# Patient Record
Sex: Male | Born: 1985 | Race: Black or African American | Hispanic: No | Marital: Single | Smoking: Current every day smoker
Health system: Southern US, Community
[De-identification: ages and names within clinical notes are randomized; demographics above are authoritative.]

## PROBLEM LIST (undated history)

## (undated) ENCOUNTER — Emergency Department (HOSPITAL_COMMUNITY): Disposition: A | Discharge status: Home / Self Care | Payer: Self-pay

## (undated) DIAGNOSIS — F32A Depression, unspecified: Secondary | ICD-10-CM

## (undated) DIAGNOSIS — F329 Major depressive disorder, single episode, unspecified: Secondary | ICD-10-CM

---
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
## 2007-12-24 ENCOUNTER — Emergency Department (HOSPITAL_COMMUNITY): Admission: EM | Admit: 2007-12-24 | Discharge: 2007-12-24 | Payer: Self-pay | Admitting: Emergency Medicine

## 2008-11-06 ENCOUNTER — Emergency Department (HOSPITAL_COMMUNITY): Admission: EM | Admit: 2008-11-06 | Discharge: 2008-11-06 | Payer: Self-pay | Admitting: Emergency Medicine

## 2008-12-23 ENCOUNTER — Emergency Department (HOSPITAL_COMMUNITY): Admission: EM | Admit: 2008-12-23 | Discharge: 2008-12-24 | Payer: Self-pay | Admitting: Emergency Medicine

## 2009-07-07 ENCOUNTER — Emergency Department (HOSPITAL_COMMUNITY): Admission: EM | Admit: 2009-07-07 | Discharge: 2009-07-07 | Payer: Self-pay | Admitting: Emergency Medicine

## 2011-04-02 LAB — GC/CHLAMYDIA PROBE AMP, GENITAL
Chlamydia, DNA Probe: NEGATIVE
GC Probe Amp, Genital: NEGATIVE

## 2011-06-18 ENCOUNTER — Emergency Department (HOSPITAL_COMMUNITY): Payer: Self-pay

## 2011-06-18 ENCOUNTER — Emergency Department (HOSPITAL_COMMUNITY)
Admission: EM | Admit: 2011-06-18 | Discharge: 2011-06-19 | Disposition: A | Discharge status: Home / Self Care | Payer: Self-pay | Attending: Emergency Medicine | Admitting: Emergency Medicine

## 2011-06-18 DIAGNOSIS — N509 Disorder of male genital organs, unspecified: Secondary | ICD-10-CM | POA: Insufficient documentation

## 2011-06-18 DIAGNOSIS — N342 Other urethritis: Secondary | ICD-10-CM | POA: Insufficient documentation

## 2011-06-18 DIAGNOSIS — R369 Urethral discharge, unspecified: Secondary | ICD-10-CM | POA: Insufficient documentation

## 2011-06-18 DIAGNOSIS — M79609 Pain in unspecified limb: Secondary | ICD-10-CM | POA: Insufficient documentation

## 2011-06-18 LAB — URINALYSIS, ROUTINE W REFLEX MICROSCOPIC
Bilirubin Urine: NEGATIVE
Glucose, UA: NEGATIVE mg/dL
Hgb urine dipstick: NEGATIVE
Ketones, ur: NEGATIVE mg/dL
Leukocytes, UA: NEGATIVE
Nitrite: NEGATIVE
Protein, ur: NEGATIVE mg/dL
Specific Gravity, Urine: 1.028 (ref 1.005–1.030)
Urobilinogen, UA: 1 mg/dL (ref 0.0–1.0)
pH: 7 (ref 5.0–8.0)

## 2011-06-21 ENCOUNTER — Emergency Department (HOSPITAL_COMMUNITY)
Admission: EM | Admit: 2011-06-21 | Discharge: 2011-06-21 | Disposition: A | Discharge status: Home / Self Care | Payer: Self-pay | Attending: Emergency Medicine | Admitting: Emergency Medicine

## 2011-06-21 DIAGNOSIS — K029 Dental caries, unspecified: Secondary | ICD-10-CM | POA: Insufficient documentation

## 2011-06-21 DIAGNOSIS — K089 Disorder of teeth and supporting structures, unspecified: Secondary | ICD-10-CM | POA: Insufficient documentation

## 2011-09-05 ENCOUNTER — Emergency Department (HOSPITAL_COMMUNITY): Payer: Self-pay

## 2011-09-05 ENCOUNTER — Emergency Department (HOSPITAL_COMMUNITY)
Admission: EM | Admit: 2011-09-05 | Discharge: 2011-09-05 | Disposition: A | Discharge status: Home / Self Care | Payer: Self-pay | Attending: Emergency Medicine | Admitting: Emergency Medicine

## 2011-09-05 DIAGNOSIS — R51 Headache: Secondary | ICD-10-CM | POA: Insufficient documentation

## 2011-09-05 DIAGNOSIS — IMO0002 Reserved for concepts with insufficient information to code with codable children: Secondary | ICD-10-CM | POA: Insufficient documentation

## 2011-09-05 DIAGNOSIS — S61209A Unspecified open wound of unspecified finger without damage to nail, initial encounter: Secondary | ICD-10-CM | POA: Insufficient documentation

## 2011-09-05 DIAGNOSIS — Y9229 Other specified public building as the place of occurrence of the external cause: Secondary | ICD-10-CM | POA: Insufficient documentation

## 2011-09-05 DIAGNOSIS — T148XXA Other injury of unspecified body region, initial encounter: Secondary | ICD-10-CM | POA: Insufficient documentation

## 2011-09-05 DIAGNOSIS — R1110 Vomiting, unspecified: Secondary | ICD-10-CM | POA: Insufficient documentation

## 2011-09-05 DIAGNOSIS — R209 Unspecified disturbances of skin sensation: Secondary | ICD-10-CM | POA: Insufficient documentation

## 2011-09-29 LAB — URINALYSIS, ROUTINE W REFLEX MICROSCOPIC
Glucose, UA: NEGATIVE
Hgb urine dipstick: NEGATIVE
Protein, ur: NEGATIVE
Specific Gravity, Urine: 1.035 — ABNORMAL HIGH
pH: 7

## 2011-09-29 LAB — DIFFERENTIAL
Basophils Relative: 0 % (ref 0–1)
Monocytes Absolute: 0.6 10*3/uL (ref 0.1–1.0)
Monocytes Relative: 10 % (ref 3–12)
Neutro Abs: 4.3 10*3/uL (ref 1.7–7.7)

## 2011-09-29 LAB — COMPREHENSIVE METABOLIC PANEL
ALT: 17 U/L (ref 0–53)
AST: 29 U/L (ref 0–37)
CO2: 25 mEq/L (ref 19–32)
Calcium: 9.2 mg/dL (ref 8.4–10.5)
Chloride: 104 mEq/L (ref 96–112)
Creatinine, Ser: 0.97 mg/dL (ref 0.4–1.5)
GFR calc Af Amer: >60 mL/min (ref >60)
GFR calc non Af Amer: >60 mL/min (ref >60)
Glucose, Bld: 84 mg/dL (ref 70–99)
Total Bilirubin: 1.4 mg/dL — ABNORMAL HIGH (ref 0.3–1.2)

## 2011-09-29 LAB — CBC
Hemoglobin: 15.9 g/dL (ref 13.0–17.0)
MCHC: 33 g/dL (ref 30.0–36.0)
MCV: 84.5 fL (ref 78.0–100.0)
RBC: 5.69 MIL/uL (ref 4.22–5.81)
WBC: 6.2 10*3/uL (ref 4.0–10.5)

## 2011-09-29 LAB — RPR: RPR Ser Ql: NONREACTIVE

## 2011-09-29 LAB — GC/CHLAMYDIA PROBE AMP, GENITAL
Chlamydia, DNA Probe: NEGATIVE
GC Probe Amp, Genital: NEGATIVE

## 2011-12-21 ENCOUNTER — Emergency Department (HOSPITAL_COMMUNITY)
Admission: EM | Admit: 2011-12-21 | Discharge: 2011-12-21 | Disposition: A | Discharge status: Home / Self Care | Payer: Self-pay | Attending: Emergency Medicine | Admitting: Emergency Medicine

## 2011-12-21 ENCOUNTER — Encounter: Payer: Self-pay | Admitting: *Deleted

## 2011-12-21 ENCOUNTER — Emergency Department (HOSPITAL_COMMUNITY): Payer: Self-pay

## 2011-12-21 DIAGNOSIS — J45909 Unspecified asthma, uncomplicated: Secondary | ICD-10-CM | POA: Insufficient documentation

## 2011-12-21 DIAGNOSIS — R197 Diarrhea, unspecified: Secondary | ICD-10-CM | POA: Insufficient documentation

## 2011-12-21 DIAGNOSIS — K529 Noninfective gastroenteritis and colitis, unspecified: Secondary | ICD-10-CM

## 2011-12-21 DIAGNOSIS — R109 Unspecified abdominal pain: Secondary | ICD-10-CM | POA: Insufficient documentation

## 2011-12-21 DIAGNOSIS — K5289 Other specified noninfective gastroenteritis and colitis: Secondary | ICD-10-CM | POA: Insufficient documentation

## 2011-12-21 DIAGNOSIS — R112 Nausea with vomiting, unspecified: Secondary | ICD-10-CM | POA: Insufficient documentation

## 2011-12-21 DIAGNOSIS — Z79899 Other long term (current) drug therapy: Secondary | ICD-10-CM | POA: Insufficient documentation

## 2011-12-21 LAB — COMPREHENSIVE METABOLIC PANEL
ALT: 13 U/L (ref 0–53)
AST: 24 U/L (ref 0–37)
Alkaline Phosphatase: 54 U/L (ref 39–117)
CO2: 24 mEq/L (ref 19–32)
GFR calc Af Amer: >90 mL/min (ref >90)
GFR calc non Af Amer: >90 mL/min (ref >90)
Glucose, Bld: 96 mg/dL (ref 70–99)
Potassium: 3.6 mEq/L (ref 3.5–5.1)
Sodium: 139 mEq/L (ref 135–145)
Total Protein: 7.7 g/dL (ref 6.0–8.3)

## 2011-12-21 LAB — DIFFERENTIAL
Basophils Absolute: 0 10*3/uL (ref 0.0–0.1)
Lymphocytes Relative: 5 % — ABNORMAL LOW (ref 12–46)
Lymphs Abs: 0.6 10*3/uL — ABNORMAL LOW (ref 0.7–4.0)
Neutro Abs: 9.8 10*3/uL — ABNORMAL HIGH (ref 1.7–7.7)
Neutrophils Relative %: 91 % — ABNORMAL HIGH (ref 43–77)

## 2011-12-21 LAB — CBC
Platelets: 176 10*3/uL (ref 150–400)
RBC: 5.55 MIL/uL (ref 4.22–5.81)
WBC: 10.8 10*3/uL — ABNORMAL HIGH (ref 4.0–10.5)

## 2011-12-21 MED ORDER — ONDANSETRON HCL 4 MG PO TABS: 4.0000 mg | ORAL_TABLET | Freq: Four times a day (QID) | ORAL | Status: DC

## 2011-12-21 MED ORDER — HYDROMORPHONE HCL PF 1 MG/ML IJ SOLN
1.0000 mg | Freq: Once | INTRAMUSCULAR | Status: AC
  Administered 2011-12-21: 1 mg via INTRAVENOUS
  Filled 2011-12-21: qty 1

## 2011-12-21 MED ORDER — CIPROFLOXACIN HCL 500 MG PO TABS: 500.0000 mg | ORAL_TABLET | Freq: Two times a day (BID) | ORAL | Status: DC

## 2011-12-21 MED ORDER — METRONIDAZOLE 500 MG PO TABS: 500.0000 mg | ORAL_TABLET | Freq: Three times a day (TID) | ORAL | Status: DC

## 2011-12-21 MED ORDER — IOHEXOL 300 MG/ML  SOLN
100.0000 mL | Freq: Once | INTRAMUSCULAR | Status: AC | PRN
  Administered 2011-12-21: 100 mL via INTRAVENOUS

## 2011-12-21 MED ORDER — ONDANSETRON HCL 4 MG/2ML IJ SOLN
4.0000 mg | Freq: Once | INTRAMUSCULAR | Status: AC
  Administered 2011-12-21: 4 mg via INTRAVENOUS
  Filled 2011-12-21: qty 2

## 2011-12-21 MED ORDER — SODIUM CHLORIDE 0.9 % IV SOLN
999.0000 mL | INTRAVENOUS | Status: DC
  Administered 2011-12-21: 12:00:00 via INTRAVENOUS

## 2011-12-21 MED ORDER — SODIUM CHLORIDE 0.9 % IV SOLN
Freq: Once | INTRAVENOUS | Status: AC
  Administered 2011-12-21: 14:00:00 via INTRAVENOUS

## 2011-12-21 MED ORDER — OXYCODONE-ACETAMINOPHEN 5-325 MG PO TABS: 1.0000 | ORAL_TABLET | ORAL | Status: DC | PRN

## 2011-12-21 MED ORDER — MORPHINE SULFATE 4 MG/ML IJ SOLN
4.0000 mg | Freq: Once | INTRAMUSCULAR | Status: AC
  Administered 2011-12-21: 4 mg via INTRAVENOUS
  Filled 2011-12-21: qty 1

## 2011-12-21 NOTE — ED Provider Notes (Signed)
The patient is moved to CDU to await CT scan for eval of abdominal pain. Additional pain medications given at his request for continued pain. Will continue to monitor.  Rodena Medin, PA 12/21/11 [phone]

## 2011-12-21 NOTE — ED Notes (Signed)
Patient states he has been vomiting and fever x 1 week and last night the vomiting got worse. Patient states he has been vomiting up green, black and yellow stuff. Patient states he is feeling weak and sick.

## 2011-12-21 NOTE — ED Notes (Signed)
Called ct spoke to steve. He is aware pt has finished his ct prep

## 2011-12-21 NOTE — ED Provider Notes (Signed)
Medical screening examination/treatment/procedure(s) were performed by non-physician practitioner and as supervising physician I was immediately available for consultation/collaboration.  Demri Poulton P Daylee Delahoz, MD 12/21/11 2003 

## 2011-12-21 NOTE — ED Notes (Signed)
Pt returned from CT °

## 2011-12-21 NOTE — ED Provider Notes (Signed)
History     CSN: [id number]  Arrival date & time 12/21/11  1022   First MD Initiated Contact with Patient 12/21/11 1108      Chief Complaint  Patient presents with  . Abdominal Pain    (Consider location/radiation/quality/duration/timing/severity/associated sxs/prior treatment) Patient is a 25 y.o. male presenting with abdominal pain. The history is provided by the patient.  Abdominal Pain The primary symptoms of the illness include abdominal pain, nausea, vomiting and diarrhea. The primary symptoms of the illness do not include fever or shortness of breath.  Symptoms associated with the illness do not include diaphoresis, hematuria or back pain.   The patient is a 25 year old, male, with a history of asthma.  He presents to the emergency department complaining of abdominal pain, and predominantly, nausea and vomiting.  He has had a few episodes of diarrhea as well.  His symptoms have been going on for approximately a week.  He denies a history of peptic ulcer disease, or alcohol use.  He has never had abdominal surgery in the past. Past Medical History  Diagnosis Date  . Asthma     History reviewed. No pertinent past surgical history.  History reviewed. No pertinent family history.  History  Substance Use Topics  . Smoking status: Current Everyday Smoker    Types: Cigarettes  . Smokeless tobacco: Not on file  . Alcohol Use: No      Review of Systems  Constitutional: Negative for fever and diaphoresis.  HENT: Negative for neck pain.   Eyes: Negative for redness.  Respiratory: Negative for cough, chest tightness and shortness of breath.   Cardiovascular: Negative for chest pain and palpitations.  Gastrointestinal: Positive for nausea, vomiting, abdominal pain and diarrhea.  Genitourinary: Negative for hematuria.  Musculoskeletal: Negative for back pain.  Neurological: Negative for headaches.  Psychiatric/Behavioral: Negative for confusion.    Allergies  Review of  patient's allergies indicates no known allergies.  Home Medications   Current Outpatient Rx  Name Route Sig Dispense Refill  . QUETIAPINE FUMARATE 100 MG PO TABS Oral Take 100 mg by mouth daily.      . SERTRALINE HCL 50 MG PO TABS Oral Take 50 mg by mouth daily.        BP 127/74  Pulse 85  Temp(Src) 97.9 F (36.6 C) (Oral)  Resp 24  SpO2 98%  Physical Exam  Constitutional: He is oriented to person, place, and time. He appears well-developed and well-nourished. No distress.       Uncomfortable appearing  HENT:  Head: Normocephalic and atraumatic.  Eyes: EOM are normal. Pupils are equal, round, and reactive to light.  Neck: Normal range of motion. Neck supple.  Cardiovascular: Normal rate, regular rhythm and normal heart sounds.   No murmur heard. Pulmonary/Chest: Effort normal and breath sounds normal. No respiratory distress. He has no wheezes. He has no rales.  Abdominal: Soft. Bowel sounds are normal. He exhibits no distension and no mass. There is tenderness. There is rebound and guarding.       Diffuse tenderness, with guarding  Musculoskeletal: Normal range of motion. He exhibits no edema and no tenderness.  Neurological: He is alert and oriented to person, place, and time. No cranial nerve deficit.  Skin: Skin is warm and dry. He is not diaphoretic.  Psychiatric: He has a normal mood and affect. His behavior is normal.    ED Course  Procedures (including critical care time) 25 year old, male, presents with one week of abdominal pain, vomiting, and diarrhea.  He he has diffuse abdominal tenderness, with guarding and rebound.  We will establish an IV perform laboratory testing, treat with analgesics, antiemetics, and also perform a CAT scan due to peritoneal signs   Labs Reviewed  CBC  DIFFERENTIAL  COMPREHENSIVE METABOLIC PANEL  LIPASE, BLOOD   No results found.   No diagnosis found.    MDM  Abdominal pain, vomiting        Nicholes Stairs,  MD 12/23/11 [phone]

## 2011-12-21 NOTE — ED Provider Notes (Signed)
Medical screening examination/treatment/procedure(s) were performed by non-physician practitioner and as supervising physician I was immediately available for consultation/collaboration.  Nicholes Stairs, MD 12/21/11 2003

## 2011-12-21 NOTE — ED Provider Notes (Signed)
pATIENT TO BE SENT HOME WITH ABX FOR COLITIS AND GI FOLLOW UP WITH ANY PERSISTENT.  Rodena Medin, PA 12/21/11 1539

## 2011-12-21 NOTE — Discharge Instructions (Signed)
FOLLOW UP WITH DR. Madilyn Fireman FOR RECHECK OF ABDOMINAL PAIN IF NO BETTER IN ANOTHER 2-3 DAYS. TAKE MEDICATIONS AS PRESCRIBED. BE AWARE THAT TAKING PERCOCET CAN CAUSE CONSTIPATION, SO TREAT TAKE THEM ONLY WHEN NEEDED.   Colitis Colitis is inflammation of the colon. Colitis can be a short-term or long-standing (chronic) illness. Crohn's disease and ulcerative colitis are 2 types of colitis which are chronic. They usually require lifelong treatment. CAUSES  There are many different causes of colitis, including:  Viruses.   Germs (bacteria).   Medicine reactions.  SYMPTOMS   Diarrhea.   Intestinal bleeding.   Pain.   Fever.   Throwing up (vomiting).   Tiredness (fatigue).   Weight loss.   Bowel blockage.  DIAGNOSIS  The diagnosis of colitis is based on examination and stool or blood tests. X-rays, CT scan, and colonoscopy may also be needed. TREATMENT  Treatment may include:  Fluids given through the vein (intravenously).   Bowel rest (nothing to eat or drink for a period of time).   Medicine for pain and diarrhea.   Medicines (antibiotics) that kill germs.   Cortisone medicines.   Surgery.  HOME CARE INSTRUCTIONS   Get plenty of rest.   Drink enough water and fluids to keep your urine clear or pale yellow.   Eat a well-balanced diet.   Call your caregiver for follow-up as recommended.  SEEK IMMEDIATE MEDICAL CARE IF:   You develop chills.   You have an oral temperature above 102 F (38.9 C), not controlled by medicine.   You have extreme weakness, fainting, or dehydration.   You have repeated vomiting.   You develop severe belly (abdominal) pain or are passing bloody or tarry stools.  MAKE SURE YOU:   Understand these instructions.   Will watch your condition.   Will get help right away if you are not doing well or get worse.  Document Released: 01/18/2005 Document Revised: 08/23/2011 Document Reviewed: 04/15/2010 [facility] Patient Information 2012  [place], [place].

## 2011-12-23 ENCOUNTER — Inpatient Hospital Stay (HOSPITAL_COMMUNITY): Payer: Self-pay

## 2011-12-23 ENCOUNTER — Encounter (HOSPITAL_COMMUNITY): Payer: Self-pay | Admitting: Emergency Medicine

## 2011-12-23 ENCOUNTER — Inpatient Hospital Stay (HOSPITAL_COMMUNITY)
Admission: EM | Admit: 2011-12-23 | Discharge: 2011-12-24 | DRG: 392 | Discharge status: Against Medical Advice | Payer: Self-pay | Attending: Internal Medicine | Admitting: Internal Medicine

## 2011-12-23 DIAGNOSIS — F17200 Nicotine dependence, unspecified, uncomplicated: Secondary | ICD-10-CM | POA: Diagnosis present

## 2011-12-23 DIAGNOSIS — R112 Nausea with vomiting, unspecified: Secondary | ICD-10-CM

## 2011-12-23 DIAGNOSIS — E860 Dehydration: Secondary | ICD-10-CM | POA: Diagnosis present

## 2011-12-23 DIAGNOSIS — K921 Melena: Secondary | ICD-10-CM

## 2011-12-23 DIAGNOSIS — R1115 Cyclical vomiting syndrome unrelated to migraine: Secondary | ICD-10-CM

## 2011-12-23 DIAGNOSIS — J45909 Unspecified asthma, uncomplicated: Secondary | ICD-10-CM | POA: Diagnosis present

## 2011-12-23 DIAGNOSIS — F329 Major depressive disorder, single episode, unspecified: Secondary | ICD-10-CM

## 2011-12-23 DIAGNOSIS — F32A Depression, unspecified: Secondary | ICD-10-CM

## 2011-12-23 DIAGNOSIS — E876 Hypokalemia: Secondary | ICD-10-CM | POA: Diagnosis present

## 2011-12-23 DIAGNOSIS — F3289 Other specified depressive episodes: Secondary | ICD-10-CM | POA: Diagnosis present

## 2011-12-23 DIAGNOSIS — R109 Unspecified abdominal pain: Principal | ICD-10-CM

## 2011-12-23 HISTORY — DX: Major depressive disorder, single episode, unspecified: F32.9

## 2011-12-23 HISTORY — DX: Depression, unspecified: F32.A

## 2011-12-23 LAB — CBC
HCT: 45.9 % (ref 39.0–52.0)
Hemoglobin: 15.9 g/dL (ref 13.0–17.0)
MCH: 28.9 pg (ref 26.0–34.0)
MCHC: 34.6 g/dL (ref 30.0–36.0)
MCV: 83.5 fL (ref 78.0–100.0)
Platelets: 185 K/uL (ref 150–400)
RBC: 5.5 MIL/uL (ref 4.22–5.81)
RDW: 12.8 % (ref 11.5–15.5)
WBC: 7.2 K/uL (ref 4.0–10.5)

## 2011-12-23 LAB — BASIC METABOLIC PANEL
BUN: 19 mg/dL (ref 6–23)
Calcium: 9.9 mg/dL (ref 8.4–10.5)
Creatinine, Ser: 1.01 mg/dL (ref 0.50–1.35)
GFR calc non Af Amer: >90 mL/min (ref >90)
Glucose, Bld: 88 mg/dL (ref 70–99)
Sodium: 137 mEq/L (ref 135–145)

## 2011-12-23 LAB — URINALYSIS, ROUTINE W REFLEX MICROSCOPIC
Glucose, UA: NEGATIVE mg/dL
Hgb urine dipstick: NEGATIVE
Ketones, ur: >80 mg/dL — AB
Protein, ur: NEGATIVE mg/dL
Urobilinogen, UA: 1 mg/dL (ref 0.0–1.0)

## 2011-12-23 LAB — RAPID URINE DRUG SCREEN, HOSP PERFORMED
Amphetamines: NOT DETECTED
Barbiturates: NOT DETECTED
Benzodiazepines: NOT DETECTED
Cocaine: POSITIVE — AB
Opiates: NOT DETECTED
Tetrahydrocannabinol: POSITIVE — AB

## 2011-12-23 LAB — DIFFERENTIAL
Eosinophils Absolute: 0.1 10*3/uL (ref 0.0–0.7)
Eosinophils Relative: 1 % (ref 0–5)
Lymphs Abs: 1.9 10*3/uL (ref 0.7–4.0)
Monocytes Absolute: 0.9 10*3/uL (ref 0.1–1.0)
Monocytes Relative: 12 % (ref 3–12)

## 2011-12-23 LAB — INFLUENZA PANEL BY PCR (TYPE A & B)
H1N1 flu by pcr: NOT DETECTED
Influenza A By PCR: NEGATIVE
Influenza B By PCR: NEGATIVE

## 2011-12-23 LAB — OCCULT BLOOD, POC DEVICE: Fecal Occult Bld: NEGATIVE

## 2011-12-23 LAB — LIPASE, BLOOD: Lipase: 21 U/L (ref 11–59)

## 2011-12-23 LAB — HIV ANTIBODY (ROUTINE TESTING W REFLEX): HIV: NONREACTIVE

## 2011-12-23 LAB — LACTIC ACID, PLASMA: Lactic Acid, Venous: 0.9 mmol/L (ref 0.5–2.2)

## 2011-12-23 MED ORDER — FENTANYL CITRATE 0.05 MG/ML IJ SOLN
50.0000 ug | Freq: Once | INTRAMUSCULAR | Status: AC
  Administered 2011-12-23: 50 ug via INTRAVENOUS
  Filled 2011-12-23: qty 2

## 2011-12-23 MED ORDER — ENOXAPARIN SODIUM 40 MG/0.4ML ~~LOC~~ SOLN
40.0000 mg | SUBCUTANEOUS | Status: DC
  Filled 2011-12-23 (×2): qty 0.4

## 2011-12-23 MED ORDER — ONDANSETRON HCL 4 MG/2ML IJ SOLN: 4.0000 mg | Freq: Four times a day (QID) | INTRAMUSCULAR | Status: DC | PRN

## 2011-12-23 MED ORDER — PANTOPRAZOLE SODIUM 40 MG IV SOLR
40.0000 mg | Freq: Every day | INTRAVENOUS | Status: DC
  Administered 2011-12-23: 40 mg via INTRAVENOUS
  Filled 2011-12-23 (×2): qty 40

## 2011-12-23 MED ORDER — ACETAMINOPHEN 325 MG PO TABS
650.0000 mg | ORAL_TABLET | Freq: Four times a day (QID) | ORAL | Status: DC | PRN
  Administered 2011-12-24: 650 mg via ORAL
  Filled 2011-12-23: qty 2

## 2011-12-23 MED ORDER — SERTRALINE HCL 50 MG PO TABS
50.0000 mg | ORAL_TABLET | Freq: Every day | ORAL | Status: DC
  Administered 2011-12-23 – 2011-12-24 (×2): 50 mg via ORAL
  Filled 2011-12-23 (×2): qty 1

## 2011-12-23 MED ORDER — PROMETHAZINE HCL 25 MG/ML IJ SOLN
25.0000 mg | INTRAMUSCULAR | Status: AC
  Administered 2011-12-23: 25 mg via INTRAMUSCULAR
  Filled 2011-12-23: qty 1

## 2011-12-23 MED ORDER — ACETAMINOPHEN 650 MG RE SUPP: 650.0000 mg | Freq: Four times a day (QID) | RECTAL | Status: DC | PRN

## 2011-12-23 MED ORDER — QUETIAPINE FUMARATE 100 MG PO TABS
100.0000 mg | ORAL_TABLET | Freq: Every day | ORAL | Status: DC
  Administered 2011-12-23 – 2011-12-24 (×2): 100 mg via ORAL
  Filled 2011-12-23 (×3): qty 1

## 2011-12-23 MED ORDER — SODIUM CHLORIDE 0.9 % IV SOLN
INTRAVENOUS | Status: DC
  Administered 2011-12-23 – 2011-12-24 (×2): via INTRAVENOUS
  Administered 2011-12-24: 150 mL/h via INTRAVENOUS

## 2011-12-23 MED ORDER — POTASSIUM CHLORIDE 10 MEQ/100ML IV SOLN
10.0000 meq | Freq: Once | INTRAVENOUS | Status: AC
  Administered 2011-12-23: 10 meq via INTRAVENOUS
  Filled 2011-12-23: qty 100

## 2011-12-23 MED ORDER — ONDANSETRON HCL 4 MG PO TABS: 4.0000 mg | ORAL_TABLET | Freq: Four times a day (QID) | ORAL | Status: DC | PRN

## 2011-12-23 MED ORDER — SODIUM CHLORIDE 0.9 % IV BOLUS (SEPSIS)
1000.0000 mL | Freq: Once | INTRAVENOUS | Status: AC
  Administered 2011-12-23: 1000 mL via INTRAVENOUS

## 2011-12-23 NOTE — ED Notes (Signed)
RN went into pt's room to assess. Pt resting calmly. Pt reports belly pain feels like cramping and headache is 9/10. No neurological deficits. Pt is not actively vomiting or having diarrh.

## 2011-12-23 NOTE — ED Notes (Signed)
PT. REPORTS PERSISTENT VOMITTING AND DIARRHEA FOR SEVERAL WEEKS , MID ABDOMINAL CRAMPING ,  SLIGHT CHILLS WITH LOW GRADE FEVER .

## 2011-12-23 NOTE — H&P (Signed)
Hospital Admission Note Date: 12/23/2011  Patient name: Michael Glover Medical record number: [id number] Date of birth: 1986/03/19 Age: 25 y.o. Gender: male PCP: No primary provider on file.  Medical Service: Internal Medicine Teaching [facility]  Attending physician: Blanch Media, MD    1st Contact:  Kristie Cowman  [phone] 2nd Contact:  Almyra Deforest   [phone] After 5 pm or weekends: 1st Contact:      Pager: [phone] 2nd Contact:      Pager: [phone]  Chief Complaint: abdominal pain  History of Present Illness: Mr. Bracco is a 24 y.o. male without significant past medical history who presents to the ED with complaints of continued abdominal pain associated with black stool x1 and black/brown emesis x1.  He initially presented to the ED 2 days ago with complaints of abdominal pain for the past 1 week or so ago that was associated with some diarrhea.  He reports that it initially appeared to get better without treatment or over the counter medications.  He reports that he experienced an abrupt onset of vomiting and large black stool which prompted him to come to the ED 2 days ago. CT scan of the abdomen in the ED on 12/21/2011 demonstrated equivocal mild wall thickening of the majority of the colon and mild colitis could not be excluded. Patient was discharged from the ED with ciprofloxacin and Flagyl. He reports today that he has not been able to fill the prescriptions and further states that the abdominal pain has increased. He denies any further vomiting but states that he has been having dry heaves, dry cough, body aches, subjective fever, chills and headache. He denies fever, sick contacts, use of NSAIDs or BC/Goody powders, no history of sickle cell anemia, no HIV, and reports that he has not eaten in 2 days.  Meds: Medications Prior to Admission  Medication Sig Dispense Refill  . QUEtiapine (SEROQUEL) 100 MG tablet Take 100 mg by mouth daily.        .  sertraline (ZOLOFT) 50 MG tablet Take 50 mg by mouth daily.          Allergies: Review of patient's allergies indicates no known allergies. Past Medical History  Diagnosis Date  . Asthma   . Depression    History reviewed. No pertinent past surgical history. No family history on file. History   Social History  . Marital Status: Single    Spouse Name: N/A    Number of Children: N/A  . Years of Education: N/A   Occupational History  . Not on file.   Social History Main Topics  . Smoking status: Current Everyday Smoker    Types: Cigarettes  . Smokeless tobacco: Not on file  . Alcohol Use: No  . Drug Use: No  . Sexually Active:     Review of Systems: Reports depression managed by [facility] otherwise negative and as per HPI  Physical Exam: Blood pressure 139/82, pulse 58, temperature 98.5 F (36.9 C), temperature source Oral, resp. rate 18, SpO2 98.00%. General: Well-developed, well-nourished, black male, resting with a blanket covering his head, lying flat on bed with legs extended, in mild distress  Head: Normocephalic, atraumatic. Eyes: PERRL, EOMI, murky sclera  Nose: Mucous membranes moist, nonerythematous Throat: Oropharynx nonerythematous, no exudate appreciated.  Neck: No deformities, masses, or tenderness noted. Supple, No carotid Bruits, no JVD. Lungs: Normal respiratory effort. Clear to auscultation BL without crackles or wheezes. Heart: RRR. S1 and S2 normal without gallop, murmur, or rubs. Abdomen/GI: Hypoactive  bowel sound, flat, positive guarding, tender to palpation epigastrium, left upper and lower quadrant and left flank , no masses or organomegaly appreciated, normal rectal tone, no external hemorrhoids, minimal stool in vault, FOBT negative  Extremities: No pretibial edema, strong pedal pulses  Neurologic: nonfocal, alert, appropriate and cooperative throughout examination.   Lab results: Basic Metabolic Panel:  Basename 12/23/11 0129  12/21/11 1151  NA 137 139  K 3.3* 3.6  CL 100 104  CO2 25 24  GLUCOSE 88 96  BUN 19 17  CREATININE 1.01 0.87  CALCIUM 9.9 9.7  MG -- --  PHOS -- --   Liver Function Tests:  Basename 12/21/11 1151  AST 24  ALT 13  ALKPHOS 54  BILITOT 1.1  PROT 7.7  ALBUMIN 4.4    Basename 12/23/11 0129 12/21/11 1151  LIPASE 21 16  AMYLASE -- --    CBC:  Basename 12/23/11 0129 12/21/11 1151  WBC 7.2 10.8*  NEUTROABS 4.4 9.8*  HGB 15.9 16.1  HCT 45.9 46.2  MCV 83.5 83.2  PLT 185 176    Urine Drug Screen:  Drugs of Abuse     Component Value Date/Time   LABOPIA NONE DETECTED 12/23/2011 0459   COCAINSCRNUR POSITIVE* 12/23/2011 0459   LABBENZ NONE DETECTED 12/23/2011 0459   AMPHETMU NONE DETECTED 12/23/2011 0459   THCU POSITIVE* 12/23/2011 0459   LABBARB NONE DETECTED 12/23/2011 0459    Urinalysis:  Range  4:59 AM  Color, Urine  YELLOW  YELLOW   APPearance  CLEAR  CLOUDY (A)   Specific Gravity, Urine  1.005 - 1.030  1.033 (H)   pH  5.0 - 8.0  5.5   Glucose, UA  NEGATIVE mg/dL  NEGATIVE   Hgb urine dipstick  NEGATIVE  NEGATIVE   Bilirubin Urine  NEGATIVE  SMALL (A)   Ketones, ur  NEGATIVE mg/dL  >91 (A)   Protein, ur  NEGATIVE mg/dL  NEGATIVE   Urobilinogen, UA  0.0 - 1.0 mg/dL  1.0   Nitrite  NEGATIVE  NEGATIVE   Leukocytes, UA  NEGATIVE  NEGATIVE     Misc. Labs: HIV pending  Imaging results:  Ct Abdomen Pelvis W Contrast  12/21/2011  *RADIOLOGY REPORT*  Clinical Data: 26 year old male with abdominal and pelvic pain with fever.  CT ABDOMEN AND PELVIS WITH CONTRAST  Technique:  Multidetector CT imaging of the abdomen and pelvis was performed following the standard protocol during bolus administration of intravenous contrast.  Contrast: OMNIPAQUE IOHEXOL 300 MG/ML IV SOLN  Comparison: None  Findings: The lung bases are clear.  The liver, gallbladder, kidneys, pancreas, spleen and adrenal glands are unremarkable.  No free fluid, enlarged lymph nodes, biliary  dilation or abdominal aortic aneurysm identified.  There is equivocal mild wall thickening of the majority of the colon and a mild colitis is not excluded. No other bowel abnormalities are identified. The appendix is normal. The bladder within normal limits.  No acute or suspicious bony abnormalities are identified.  IMPRESSION: Equivocal mild diffuse colonic wall thickening - mild colitis is not excluded.  Correlate clinically with symptoms.  No other abnormalities identified.  Original Report Authenticated By: Rosendo Gros, M.D.   Dg Abd Portable 1v  12/23/2011  *RADIOLOGY REPORT*  Clinical Data: Abdominal pain and colitis  ABDOMEN - 1 VIEW  Comparison: CT abdomen pelvis with contrast 12/21/2011  Findings: The bowel gas pattern is nonobstructive.  Contrast material is seen within the colon from the recent CT abdomen pelvis.  No abdominal  mass effect.  The lung bases are clear. Calcified phleboliths in the pelvis bilaterally.  The bones appear normal.  IMPRESSION: Nonobstructive bowel gas pattern.  Some residual oral contrast is seen in the colon.  Original Report Authenticated By: Britta Mccreedy, M.D.     Assessment & Plan by Problem: 25 year old admitted with abdominal pain for over a week, black emesis x1 and black stool x1 with negative FOBT in addition to symptoms consistent with URI.   #1 Abdominal pain: Clinical presentation concerning for colitis, intestinal ischemia, gastritis, and  gastroenteritis. CT scan of the abdomen/pelvis could not exclude colitis, KUB without acute findings.  Patient denies diarrhea for well over a week thus C. difficile lower on the differential at this point. Urine drug screen positive for cocaine and marijuana metabolites. Given history of nausea, black emesis, and black stool his presentation is concerning for possible gastric ulcer and/or ischemic colitis. Recent CBC demonstrated a left shift with elevated neutrophils of 91%. Normal lipase and liver function tests. He  does not appear to have acute abdomen. Plan -Keep n.p.o. for now -Repeat FOBT x2 -start IV Protonix -check lactic acid -Obtain GI consult for further evaluation and management recommendations  #2 questionable Upper Respiratory Infection: Although his lung exam was clear, patient reports  dry cough, myalgias, headaches and subjective fevers and chills which was concerning for influenza.  Influenza Panel negative. Plan -treat symptomatically with antipyretics and mild analgesics -start IV normal saline   #3 Depression: Currently stable, he is followed by  Marlaine Hind with most recent office visit 3 months ago per patient report Plan -will continue home regimen of quetiapine and sertraline  #4 DVT ppx: Lovenox qd  Signed: Kristie Cowman 12/23/2011, 11:33 AM

## 2011-12-23 NOTE — ED Notes (Signed)
First meeting patient. Patient sleeping. Patient woke up when I called his name and responded with Yes when asked if he was all right this morning. NAD at this time.

## 2011-12-23 NOTE — ED Notes (Signed)
Patient is resting comfortably. 

## 2011-12-23 NOTE — Progress Notes (Signed)
Pt refusing additional lab work to be completed at this time, Dr. Bosie Clos notified.

## 2011-12-23 NOTE — ED Notes (Addendum)
Pt ready for transfer to 5155. Refused additional lab work at this time; requests waiting until later; had labs drawn less than 10 minutes ago.

## 2011-12-23 NOTE — ED Notes (Signed)
MD at bedside. 

## 2011-12-23 NOTE — Consult Note (Signed)
Reason for Consult abdominal pain: Questionable abnormal CT Referring Physician: Medical teaching service  Michael Glover is an 25 y.o. male.  HPI: Patient with an essentially negative GI history who had onset of abdominal pain and nausea and vomiting about a week ago and possibly threw up a little bit of blood but has not seen any blood in his bowels in fact has actually been constipated since he has not eatent much but did not have diarrhea and has not had any sick contacts or any at risk meals. He is actually hungry now and wants to eat. He came to the emergency room 2 days ago and a CT was done and he was given antibiotics but did not fill them. Prior to this he had no GI symptoms and his family history is negative as far as he knows although he does not know his family much. He smokes but does not drink and denies drug use and minimizes over-the-counter use and has no other specific complaints and has no new medicine Past Medical History  Diagnosis Date  . Asthma   . Depression     History reviewed. No pertinent past surgical history.  No family history on file.  Social History:  reports that he has been smoking Cigarettes.  He does not have any smokeless tobacco history on file. He reports that he does not drink alcohol or use illicit drugs.  Allergies: No Known Allergies  Medications: I have reviewed the patient's current medications.  Results for orders placed during the hospital encounter of 12/23/11 (from the past 48 hour(s))  CBC     Status: Normal   Collection Time   12/23/11  1:29 AM      Component Value Range Comment   WBC 7.2  4.0 - 10.5 (K/uL)    RBC 5.50  4.22 - 5.81 (MIL/uL)    Hemoglobin 15.9  13.0 - 17.0 (g/dL)    HCT 57.8  46.9 - 62.9 (%)    MCV 83.5  78.0 - 100.0 (fL)    MCH 28.9  26.0 - 34.0 (pg)    MCHC 34.6  30.0 - 36.0 (g/dL)    RDW 52.8  41.3 - 24.4 (%)    Platelets 185  150 - 400 (K/uL)   DIFFERENTIAL     Status: Normal   Collection Time   12/23/11   1:29 AM      Component Value Range Comment   Neutrophils Relative 61  43 - 77 (%)    Neutro Abs 4.4  1.7 - 7.7 (K/uL)    Lymphocytes Relative 26  12 - 46 (%)    Lymphs Abs 1.9  0.7 - 4.0 (K/uL)    Monocytes Relative 12  3 - 12 (%)    Monocytes Absolute 0.9  0.1 - 1.0 (K/uL)    Eosinophils Relative 1  0 - 5 (%)    Eosinophils Absolute 0.1  0.0 - 0.7 (K/uL)    Basophils Relative 0  0 - 1 (%)    Basophils Absolute 0.0  0.0 - 0.1 (K/uL)   BASIC METABOLIC PANEL     Status: Abnormal   Collection Time   12/23/11  1:29 AM      Component Value Range Comment   Sodium 137  135 - 145 (mEq/L)    Potassium 3.3 (*) 3.5 - 5.1 (mEq/L)    Chloride 100  96 - 112 (mEq/L)    CO2 25  19 - 32 (mEq/L)    Glucose, Bld 88  70 - 99 (mg/dL)    BUN 19  6 - 23 (mg/dL)    Creatinine, Ser 8.29  0.50 - 1.35 (mg/dL)    Calcium 9.9  8.4 - 10.5 (mg/dL)    GFR calc non Af Amer >90  >90 (mL/min)    GFR calc Af Amer >90  >90 (mL/min)   LIPASE, BLOOD     Status: Normal   Collection Time   12/23/11  1:29 AM      Component Value Range Comment   Lipase 21  11 - 59 (U/L)   URINALYSIS, ROUTINE W REFLEX MICROSCOPIC     Status: Abnormal   Collection Time   12/23/11  4:59 AM      Component Value Range Comment   Color, Urine YELLOW  YELLOW     APPearance CLOUDY (*) CLEAR     Specific Gravity, Urine 1.033 (*) 1.005 - 1.030     pH 5.5  5.0 - 8.0     Glucose, UA NEGATIVE  NEGATIVE (mg/dL)    Hgb urine dipstick NEGATIVE  NEGATIVE     Bilirubin Urine SMALL (*) NEGATIVE     Ketones, ur >80 (*) NEGATIVE (mg/dL)    Protein, ur NEGATIVE  NEGATIVE (mg/dL)    Urobilinogen, UA 1.0  0.0 - 1.0 (mg/dL)    Nitrite NEGATIVE  NEGATIVE     Leukocytes, UA NEGATIVE  NEGATIVE  MICROSCOPIC NOT DONE ON URINES WITH NEGATIVE PROTEIN, BLOOD, LEUKOCYTES, NITRITE, OR GLUCOSE <1000 mg/dL.  URINE RAPID DRUG SCREEN (HOSP PERFORMED)     Status: Abnormal   Collection Time   12/23/11  4:59 AM      Component Value Range Comment   Opiates NONE  DETECTED  NONE DETECTED     Cocaine POSITIVE (*) NONE DETECTED     Benzodiazepines NONE DETECTED  NONE DETECTED     Amphetamines NONE DETECTED  NONE DETECTED     Tetrahydrocannabinol POSITIVE (*) NONE DETECTED     Barbiturates NONE DETECTED  NONE DETECTED    OCCULT BLOOD, POC DEVICE     Status: Normal   Collection Time   12/23/11  8:36 AM      Component Value Range Comment   Fecal Occult Bld NEGATIVE     INFLUENZA PANEL BY PCR     Status: Normal   Collection Time   12/23/11  8:57 AM      Component Value Range Comment   Influenza A By PCR NEGATIVE  NEGATIVE     Influenza B By PCR NEGATIVE  NEGATIVE     H1N1 flu by pcr NOT DETECTED  NOT DETECTED      Dg Abd Portable 1v  12/23/2011  *RADIOLOGY REPORT*  Clinical Data: Abdominal pain and colitis  ABDOMEN - 1 VIEW  Comparison: CT abdomen pelvis with contrast 12/21/2011  Findings: The bowel gas pattern is nonobstructive.  Contrast material is seen within the colon from the recent CT abdomen pelvis.  No abdominal mass effect.  The lung bases are clear. Calcified phleboliths in the pelvis bilaterally.  The bones appear normal.  IMPRESSION: Nonobstructive bowel gas pattern.  Some residual oral contrast is seen in the colon.  Original Report Authenticated By: Britta Mccreedy, M.D.    ROS negative except above Blood pressure 144/91, pulse 68, temperature 98.5 F (36.9 C), temperature source Oral, resp. rate 18, height 5\' 10"  (1.778 m), weight 70.308 kg (155 lb), SpO2 98.00%. Physical Exam no acute distress alert and oriented x3 sclera nonicteric exam pertinent for his  abdomen being soft good bowel sounds no guarding or rebound very minimal left greater than right discomfort. CT reviewed KUB okay white count decreased other labs okay  Assessment/Plan: Abdominal pain questionable etiology Plan: Will allow clear liquid diet and reevaluate tomorrow and based on symptoms at that time decide any further workup and plans  [facility] E 12/23/2011, 2:46  PM

## 2011-12-23 NOTE — ED Provider Notes (Signed)
History     CSN: [id number]  Arrival date & time 12/23/11  0117   First MD Initiated Contact with Patient 12/23/11 0430      Chief Complaint  Patient presents with  . Emesis    (Consider location/radiation/quality/duration/timing/severity/associated sxs/prior treatment) Patient is a 25 y.o. male presenting with vomiting. The history is provided by the patient.  Emesis  This is a recurrent problem. The current episode started more than 2 days ago. The problem occurs 5 to 10 times per day. The problem has not changed since onset.The emesis has an appearance of stomach contents. The maximum temperature recorded prior to his arrival was 100 to 100.9 F. The fever has been present for 1 to 2 days. Associated symptoms include abdominal pain, chills, diarrhea and sweats. Pertinent negatives include no cough, no fever, no headaches and no URI. Risk factors: No known sick contacts.   Patient with persistent nausea vomiting diarrhea despite pain medications and Zofran at home. The senior 2 days ago had a CAT scan showed possible colitis was sent with Cipro. Symptoms have not improved he continues to vomit nonbloody nonbilious emesis. He is also having diarrhea without any blood in stools. No black or tarry stools. No change in persistent diffuse abdominal discomfort. Pain is sharp in quality no radiation. No trauma. The touch his abdomen no known aggravating or alleviating factors.  Past Medical History  Diagnosis Date  . Asthma     History reviewed. No pertinent past surgical history.  No family history on file.  History  Substance Use Topics  . Smoking status: Current Everyday Smoker    Types: Cigarettes  . Smokeless tobacco: Not on file  . Alcohol Use: No      Review of Systems  Constitutional: Positive for chills. Negative for fever.  HENT: Negative for neck pain and neck stiffness.   Eyes: Negative for pain.  Respiratory: Negative for cough and shortness of breath.     Cardiovascular: Negative for chest pain.  Gastrointestinal: Positive for nausea, vomiting, abdominal pain and diarrhea. Negative for rectal pain.  Genitourinary: Negative for dysuria.  Musculoskeletal: Negative for back pain.  Skin: Negative for rash.  Neurological: Negative for headaches.  All other systems reviewed and are negative.    Allergies  Review of patient's allergies indicates no known allergies.  Home Medications   Current Outpatient Rx  Name Route Sig Dispense Refill  . CIPROFLOXACIN HCL 500 MG PO TABS Oral Take 1 tablet (500 mg total) by mouth 2 (two) times daily. 28 tablet 0  . METRONIDAZOLE 500 MG PO TABS Oral Take 1 tablet (500 mg total) by mouth 3 (three) times daily. 21 tablet 0  . ONDANSETRON HCL 4 MG PO TABS Oral Take 1 tablet (4 mg total) by mouth every 6 (six) hours. 12 tablet 0  . OXYCODONE-ACETAMINOPHEN 5-325 MG PO TABS Oral Take 1 tablet by mouth every 4 (four) hours as needed for pain. 12 tablet 0  . QUETIAPINE FUMARATE 100 MG PO TABS Oral Take 100 mg by mouth daily.      . SERTRALINE HCL 50 MG PO TABS Oral Take 50 mg by mouth daily.        BP 130/68  Pulse 65  Temp(Src) 97.8 F (36.6 C) (Oral)  Resp 18  SpO2 99%  Physical Exam  Constitutional: He is oriented to person, place, and time. He appears well-developed and well-nourished.  HENT:  Head: Normocephalic and atraumatic.  Eyes: Conjunctivae and EOM are normal. Pupils are equal,  round, and reactive to light.  Neck: Trachea normal. Neck supple. No thyromegaly present.  Cardiovascular: Normal rate, regular rhythm, S1 normal, S2 normal and normal pulses.     No systolic murmur is present   No diastolic murmur is present  Pulses:      Radial pulses are 2+ on the right side, and 2+ on the left side.  Pulmonary/Chest: Effort normal and breath sounds normal. He has no wheezes. He has no rhonchi. He has no rales. He exhibits no tenderness.  Abdominal: Soft. Normal appearance and bowel sounds are  normal. There is no rebound, no guarding, no CVA tenderness and negative Murphy's sign.       Mild diffuse tenderness  Musculoskeletal:       BLE:s Calves nontender, no cords or erythema, negative Homans sign  Neurological: He is alert and oriented to person, place, and time. He has normal strength. No cranial nerve deficit or sensory deficit. GCS eye subscore is 4. GCS verbal subscore is 5. GCS motor subscore is 6.  Skin: Skin is warm and dry. No rash noted. He is not diaphoretic.  Psychiatric: His speech is normal.       Cooperative and appropriate    ED Course  Procedures (including critical care time)  Labs Reviewed  BASIC METABOLIC PANEL - Abnormal; Notable for the following:    Potassium 3.3 (*)    All other components within normal limits  URINALYSIS, ROUTINE W REFLEX MICROSCOPIC - Abnormal; Notable for the following:    APPearance CLOUDY (*)    Specific Gravity, Urine 1.033 (*)    Bilirubin Urine SMALL (*)    Ketones, ur >80 (*)    All other components within normal limits  CBC  DIFFERENTIAL   Ct Abdomen Pelvis W Contrast  12/21/2011  *RADIOLOGY REPORT*  Clinical Data: 25 year old male with abdominal and pelvic pain with fever.  CT ABDOMEN AND PELVIS WITH CONTRAST  Technique:  Multidetector CT imaging of the abdomen and pelvis was performed following the standard protocol during bolus administration of intravenous contrast.  Contrast: OMNIPAQUE IOHEXOL 300 MG/ML IV SOLN  Comparison: None  Findings: The lung bases are clear.  The liver, gallbladder, kidneys, pancreas, spleen and adrenal glands are unremarkable.  No free fluid, enlarged lymph nodes, biliary dilation or abdominal aortic aneurysm identified.  There is equivocal mild wall thickening of the majority of the colon and a mild colitis is not excluded. No other bowel abnormalities are identified. The appendix is normal. The bladder within normal limits.  No acute or suspicious bony abnormalities are identified.   IMPRESSION: Equivocal mild diffuse colonic wall thickening - mild colitis is not excluded.  Correlate clinically with symptoms.  No other abnormalities identified.  Original Report Authenticated By: Rosendo Gros, M.D.     1. Persistent vomiting   2. Dehydration    Case discussed with outpatient clinics resident as above. Plan admit dehydration system vomiting. Mild hypokalemia   MDM   Persistent emesis and clinical dehydration. IV fluids provided him for persistent symptoms a medical consult for admission. Potassium IV        Sunnie Nielsen, MD 12/23/11 [phone]

## 2011-12-23 NOTE — ED Notes (Signed)
Patient moved to [facility].

## 2011-12-24 DIAGNOSIS — R1115 Cyclical vomiting syndrome unrelated to migraine: Secondary | ICD-10-CM

## 2011-12-24 NOTE — Plan of Care (Signed)
Problem: Discharge Progression Outcomes Goal: Other Discharge Outcomes/Goals Patient signed himself out AMA. Irena Reichmann 12/24/2011

## 2011-12-24 NOTE — Progress Notes (Signed)
Pt. Was verbally abusive to his lady visitor this a.m. She left the hospital and returned with a note for the nurse that the patient had drugs in his right thigh pocket. Security called and pt searched. No drugs found. Pt stated he was leaving. Dr. Bosie Clos called. Pt signed AMA form and left the hospital. Irena Reichmann 12/24/2011

## 2011-12-24 NOTE — H&P (Signed)
Internal Medicine Teaching Service Attending Note Date: 12/24/2011  Patient name: Michael Glover  Medical record number: [id number]  Date of birth: 15-May-1986   I have seen and evaluated Michael Glover and discussed their care with the Residency Team.   I verified Dr Schooler's history. Pt gave no additional details other than no BM for 3 days and total 2 episodes of emesis. No prior abd pain, loose stools, blood in stools. Feeling a bit better today and requesting solid food.  Pt denies using cocaine. When told about UDS results, pt states must have "been in his pills."  Physical Exam: Blood pressure 114/62, pulse 59, temperature 97.9 F (36.6 C), temperature source Oral, resp. rate 18, height 5\' 10"  (1.778 m), weight 155 lb (70.308 kg), SpO2 97.00%. Pt is lying in bed. Girlfriend at bedside. NAD. Pt has normoactive BS. Guards with minimal pressure and states pain all over with palpation. Refused additional ABD exam. I then used stethoscope and could palpate deeply with stethoscope without pt guarding nor grimacing.  Lab results: Results for orders placed during the hospital encounter of 12/23/11 (from the past 24 hour(s))  HIV ANTIBODY (ROUTINE TESTING)     Status: Normal   Collection Time   12/23/11  2:00 PM      Component Value Range   HIV NON REACTIVE  NON REACTIVE   LACTIC ACID, PLASMA     Status: Normal   Collection Time   12/23/11  2:00 PM      Component Value Range   Lactic Acid, Venous 0.9  0.5 - 2.2 (mmol/L)    Imaging results:  Dg Abd Portable 1v  12/23/2011  *RADIOLOGY REPORT*  Clinical Data: Abdominal pain and colitis  ABDOMEN - 1 VIEW  Comparison: CT abdomen pelvis with contrast 12/21/2011  Findings: The bowel gas pattern is nonobstructive.  Contrast material is seen within the colon from the recent CT abdomen pelvis.  No abdominal mass effect.  The lung bases are clear. Calcified phleboliths in the pelvis bilaterally.  The bones appear normal.  IMPRESSION:  Nonobstructive bowel gas pattern.  Some residual oral contrast is seen in the colon.  Original Report Authenticated By: Britta Mccreedy, M.D.    Assessment and Plan: I agree with the formulated Assessment and Plan with the following changes:  1. ABD pain, non-specific - No specific etiology is apparent. GI has evaluate pt and recommended sxs treatment. Will advance diet - recommended bland, non-dairy diet. If can tolerate, can be discharged. I encouraged pt to get PCP as outpt. Will D/C on H2B for possible gastritis.   2. URI - flu negative. Feeling better. Sxs treatment.  3. Depression - F/U with [facility]. Cont meds

## 2011-12-25 NOTE — Discharge Summary (Signed)
Internal Medicine Teaching [facility] Discharge Note  Name: Michael Glover MRN: [id number] DOB: 1986/04/17 25 y.o.  Date of Admission: 12/23/2011  1:26 AM Date of Discharge: 12/25/2011 Attending Physician: No att. providers found  Discharge Diagnosis: Principal Problem:  *Abdominal pain Active Problems:  Nausea & vomiting  Depression  Black stool   Discharge Medications: Discharge Medication List as of 12/24/2011 12:58 PM    CONTINUE these medications which have NOT CHANGED   Details  QUEtiapine (SEROQUEL) 100 MG tablet Take 100 mg by mouth daily.  , Until Discontinued, Historical Med    sertraline (ZOLOFT) 50 MG tablet Take 50 mg by mouth daily.  , Until Discontinued, Historical Med      STOP taking these medications     ciprofloxacin (CIPRO) 500 MG tablet      metroNIDAZOLE (FLAGYL) 500 MG tablet      ondansetron (ZOFRAN) 4 MG tablet      oxyCODONE-acetaminophen (PERCOCET) 5-325 MG per tablet         Disposition and follow-up:   Michael Glover left the hospital AGAINST MEDICAL ADVICE but was in stable condition  Follow-up Appointments: None  Consultations: Treatment Team: [facility]  Petra Kuba, MD  Procedures Performed:  Ct Abdomen Pelvis W Contrast  12/21/2011  *RADIOLOGY REPORT*  Clinical Data: 25 year old male with abdominal and pelvic pain with fever.  CT ABDOMEN AND PELVIS WITH CONTRAST  Technique:  Multidetector CT imaging of the abdomen and pelvis was performed following the standard protocol during bolus administration of intravenous contrast.  Contrast: OMNIPAQUE IOHEXOL 300 MG/ML IV SOLN  Comparison: None  Findings: The lung bases are clear.  The liver, gallbladder, kidneys, pancreas, spleen and adrenal glands are unremarkable.  No free fluid, enlarged lymph nodes, biliary dilation or abdominal aortic aneurysm identified.  There is equivocal mild wall thickening of the majority of the colon and a mild colitis is not  excluded. No other bowel abnormalities are identified. The appendix is normal. The bladder within normal limits.  No acute or suspicious bony abnormalities are identified.  IMPRESSION: Equivocal mild diffuse colonic wall thickening - mild colitis is not excluded.  Correlate clinically with symptoms.  No other abnormalities identified.  Original Report Authenticated By: Rosendo Gros, M.D.   Dg Abd Portable 1v  12/23/2011  *RADIOLOGY REPORT*  Clinical Data: Abdominal pain and colitis  ABDOMEN - 1 VIEW  Comparison: CT abdomen pelvis with contrast 12/21/2011  Findings: The bowel gas pattern is nonobstructive.  Contrast material is seen within the colon from the recent CT abdomen pelvis.  No abdominal mass effect.  The lung bases are clear. Calcified phleboliths in the pelvis bilaterally.  The bones appear normal.  IMPRESSION: Nonobstructive bowel gas pattern.  Some residual oral contrast is seen in the colon.  Original Report Authenticated By: Britta Mccreedy, M.D.   Admission HPI: Michael Glover is a 25 y.o. male without significant past medical history who presents to the ED with complaints of continued abdominal pain associated with black stool x1 and black/brown emesis x1. He initially presented to the ED 2 days ago with complaints of abdominal pain for the past 1 week or so ago that was associated with some diarrhea. He reports that it initially appeared to get better without treatment or over the counter medications. He reports that he experienced an abrupt onset of vomiting and large black stool which prompted him to come to the ED 2 days ago. CT scan of the abdomen in the ED  on 12/21/2011 demonstrated equivocal mild wall thickening of the majority of the colon and mild colitis could not be excluded. Patient was discharged from the ED with ciprofloxacin and Flagyl. He reports today that he has not been able to fill the prescriptions and further states that the abdominal pain has increased. He denies any  further vomiting but states that he has been having dry heaves, dry cough, body aches, subjective fever, chills and headache. He denies fever, sick contacts, use of NSAIDs or BC/Goody powders, no history of sickle cell anemia, no HIV, and reports that he has not eaten in 2 days.  Physical Exam:  Blood pressure 139/82, pulse 58, temperature 98.5 F (36.9 C), temperature source Oral, resp. rate 18, SpO2 98.00%. General: Well-developed, well-nourished, black male, resting with a blanket covering his head, lying flat on bed with legs extended, in mild distress  Head: Normocephalic, atraumatic. Eyes: PERRL, EOMI, murky sclera  Nose: Mucous membranes moist, nonerythematous Throat: Oropharynx nonerythematous, no exudate appreciated.  Neck: No deformities, masses, or tenderness noted. Supple, No carotid Bruits, no JVD. Lungs: Normal respiratory effort. Clear to auscultation BL without crackles or wheezes. Heart: RRR. S1 and S2 normal without gallop, murmur, or rubs. Abdomen/GI: Hypoactive bowel sound, flat, positive guarding, tender to palpation epigastrium, left upper and lower quadrant and left flank , no masses or organomegaly appreciated, normal rectal tone, no external hemorrhoids, minimal stool in vault, FOBT negative  Extremities: No pretibial edema, strong pedal pulses  Neurologic: nonfocal, alert, appropriate and cooperative throughout examination.   Hospital Course by problem list:  #1 Abdominal pain/questionable intestinal ischemia: Patient's clinical presentation was concerning for colitis, intestinal ischemia, gastritis, and gastroenteritis. CT scan of the abdomen/pelvis could not exclude colitis, KUB without acute findings. Urine drug screen positive for cocaine and marijuana metabolites. Exam was conflicting in that palpation by hand resulted in voluntary guarding and severe pain but palpation with stethoscope did not produce guarding nor pain. Symptoms likely secondary to cocaine-induced  intestinal ischemia. Patient denies cocaine use and stated that cocaine must have been in his depression pills. Although FOBT was negative, given his reported history of nausea, black emesis, and black stool a patient was started on IV Protonix and GI consult was obtained.  Patient was evaluated by Dr. Ewing Schlein of [facility] with recommendations for advancing diet  with further workup and plans based on patient's symptoms.  #2 questionable Upper Respiratory Infection: Although his lung exam was clear, patient reported dry cough, myalgias, headaches and subjective fevers and chills which was concerning for influenza. Influenza Panel was negative and he was treat symptomatically with antipyretics, mild analgesics and IV normal saline.   #3 Depression and possible bipolar disorder: On admission patient mood was stable. The day following admission patient was visited by several  friends including his girlfriend. There were reported verbal altercations and reports of possible drug harboring by the patient. Security was called by nursing and patient was searched but no drugs were found. Of note Dr. Bosie Clos arrived to evaluate the situation after the above occurred. Patient was arguing loudly and speaking about pending court dates, lawyers and "how many other people bring drugs into the hospital without being searched".  Patient was not hostile towards M.D. but was demanding to leave the hospital AGAINST MEDICAL ADVICE. He did take his Seroquel and allowed Dr. Bosie Clos to assess him before leaving. On exam he reported no abdominal pain and tolerating full meal which his friends brought up from the cafeteria and he reports eating  without nausea, vomiting or abdominal pain. He is followed by Marlaine Hind.    Discharge Vitals:  BP 114/62  Pulse 59  Temp(Src) 97.9 F (36.6 C) (Oral)  Resp 18  Ht 5\' 10"  (1.778 m)  Wt 155 lb (70.308 kg)  BMI 22.24 kg/m2  SpO2 97%  Discharge Labs:  Admission on 12/23/2011,  Discharged on 12/24/2011  Component Date Value Range Status  . WBC (K/uL) 12/23/2011 7.2  4.0-10.5 Final  . RBC (MIL/uL) 12/23/2011 5.50  4.22-5.81 Final  . Hemoglobin (g/dL) 13/07/6577 46.9  62.9-52.8 Final  . HCT (%) 12/23/2011 45.9  39.0-52.0 Final  . MCV (fL) 12/23/2011 83.5  78.0-100.0 Final  . MCH (pg) 12/23/2011 28.9  26.0-34.0 Final  . MCHC (g/dL) 41/32/4401 02.7  25.3-66.4 Final  . RDW (%) 12/23/2011 12.8  11.5-15.5 Final  . Platelets (K/uL) 12/23/2011 185  150-400 Final  . Neutrophils Relative (%) 12/23/2011 61  43-77 Final  . Neutro Abs (K/uL) 12/23/2011 4.4  1.7-7.7 Final  . Lymphocytes Relative (%) 12/23/2011 26  12-46 Final  . Lymphs Abs (K/uL) 12/23/2011 1.9  0.7-4.0 Final  . Monocytes Relative (%) 12/23/2011 12  3-12 Final  . Monocytes Absolute (K/uL) 12/23/2011 0.9  0.1-1.0 Final  . Eosinophils Relative (%) 12/23/2011 1  0-5 Final  . Eosinophils Absolute (K/uL) 12/23/2011 0.1  0.0-0.7 Final  . Basophils Relative (%) 12/23/2011 0  0-1 Final  . Basophils Absolute (K/uL) 12/23/2011 0.0  0.0-0.1 Final  . Sodium (mEq/L) 12/23/2011 137  135-145 Final  . Potassium (mEq/L) 12/23/2011 3.3* 3.5-5.1 Final  . Chloride (mEq/L) 12/23/2011 100  96-112 Final  . CO2 (mEq/L) 12/23/2011 25  19-32 Final  . Glucose, Bld (mg/dL) 40/34/7425 88  95-63 Final  . BUN (mg/dL) 87/56/4332 19  9-51 Final  . Creatinine, Ser (mg/dL) 88/41/6606 3.01  6.01-0.93 Final  . Calcium (mg/dL) 23/55/7322 9.9  0.2-54.2 Final  . GFR calc non Af Amer (mL/min) 12/23/2011 >90  >90 Final  . GFR calc Af Amer (mL/min) 12/23/2011 >90  >90 Final   Comment:                                 The eGFR has been calculated                          using the CKD EPI equation.                          This calculation has not been                          validated in all clinical                          situations.                          eGFR's persistently                          <90 mL/min signify                           possible Chronic Kidney Disease.  . Color, Urine  12/23/2011 YELLOW  YELLOW Final  .  APPearance  12/23/2011 CLOUDY* CLEAR Final  . Specific Gravity, Urine  12/23/2011 1.033* 1.005-1.030 Final  . pH  12/23/2011 5.5  5.0-8.0 Final  . Glucose, UA (mg/dL) 16/09/9603 NEGATIVE  NEGATIVE Final  . Hgb urine dipstick  12/23/2011 NEGATIVE  NEGATIVE Final  . Bilirubin Urine  12/23/2011 SMALL* NEGATIVE Final  . Ketones, ur (mg/dL) 54/08/8118 >14* NEGATIVE Final  . Protein, ur (mg/dL) 78/29/5621 NEGATIVE  NEGATIVE Final  . Urobilinogen, UA (mg/dL) 30/86/5784 1.0  6.9-6.2 Final  . Nitrite  12/23/2011 NEGATIVE  NEGATIVE Final  . Leukocytes, UA  12/23/2011 NEGATIVE  NEGATIVE Final   MICROSCOPIC NOT DONE ON URINES WITH NEGATIVE PROTEIN, BLOOD, LEUKOCYTES, NITRITE, OR GLUCOSE <1000 mg/dL.  Marland Kitchen Influenza A By PCR  12/23/2011 NEGATIVE  NEGATIVE Final  . Influenza B By PCR  12/23/2011 NEGATIVE  NEGATIVE Final  . H1N1 flu by pcr  12/23/2011 NOT DETECTED  NOT DETECTED Final   Comment:                                 The Xpert Flu assay (FDA approved for                          nasal aspirates or washes and                          nasopharyngeal swab specimens), is                          intended as an aid in the diagnosis of                          influenza and should not be used as                          a sole basis for treatment.  Marland Kitchen HIV  12/23/2011 NON REACTIVE  NON REACTIVE Final  . Opiates  12/23/2011 NONE DETECTED  NONE DETECTED Final  . Cocaine  12/23/2011 POSITIVE* NONE DETECTED Final  . Benzodiazepines  12/23/2011 NONE DETECTED  NONE DETECTED Final  . Amphetamines  12/23/2011 NONE DETECTED  NONE DETECTED Final  . Tetrahydrocannabinol  12/23/2011 POSITIVE* NONE DETECTED Final  . Barbiturates  12/23/2011 NONE DETECTED  NONE DETECTED Final   Comment:                                 DRUG SCREEN FOR MEDICAL PURPOSES                          ONLY.  IF CONFIRMATION IS NEEDED                           FOR ANY PURPOSE, NOTIFY LAB                          WITHIN 5 DAYS.  LOWEST DETECTABLE LIMITS                          FOR URINE DRUG SCREEN                          Drug Class       Cutoff (ng/mL)                          Amphetamine      1000                          Barbiturate      200                          Benzodiazepine   200                          Tricyclics       300                          Opiates          300                          Cocaine          300                          THC              50  . Fecal Occult Bld  12/23/2011 NEGATIVE   Final  . Lipase (U/L) 12/23/2011 21  11-59 Final  . Lactic Acid, Venous (mmol/L) 12/23/2011 0.9  0.5-2.2 Final    Signed: Kristie Cowman 12/25/2011, 2:40 PM

## 2018-05-23 ENCOUNTER — Emergency Department (HOSPITAL_COMMUNITY)
Admission: EM | Admit: 2018-05-23 | Discharge: 2018-05-23 | Disposition: A | Discharge status: Home / Self Care | Payer: Self-pay | Attending: Emergency Medicine | Admitting: Emergency Medicine

## 2018-05-23 ENCOUNTER — Emergency Department (HOSPITAL_COMMUNITY): Payer: Self-pay

## 2018-05-23 ENCOUNTER — Other Ambulatory Visit: Payer: Self-pay

## 2018-05-23 ENCOUNTER — Encounter (HOSPITAL_COMMUNITY): Payer: Self-pay | Admitting: *Deleted

## 2018-05-23 DIAGNOSIS — R112 Nausea with vomiting, unspecified: Secondary | ICD-10-CM | POA: Insufficient documentation

## 2018-05-23 DIAGNOSIS — R0981 Nasal congestion: Secondary | ICD-10-CM | POA: Insufficient documentation

## 2018-05-23 DIAGNOSIS — R109 Unspecified abdominal pain: Secondary | ICD-10-CM

## 2018-05-23 DIAGNOSIS — R1110 Vomiting, unspecified: Secondary | ICD-10-CM

## 2018-05-23 DIAGNOSIS — R509 Fever, unspecified: Secondary | ICD-10-CM | POA: Insufficient documentation

## 2018-05-23 DIAGNOSIS — F17210 Nicotine dependence, cigarettes, uncomplicated: Secondary | ICD-10-CM | POA: Insufficient documentation

## 2018-05-23 DIAGNOSIS — R1084 Generalized abdominal pain: Secondary | ICD-10-CM | POA: Insufficient documentation

## 2018-05-23 DIAGNOSIS — R197 Diarrhea, unspecified: Secondary | ICD-10-CM | POA: Insufficient documentation

## 2018-05-23 LAB — CBC
HEMATOCRIT: 50.3 % (ref 39.0–52.0)
HEMOGLOBIN: 17.2 g/dL — AB (ref 13.0–17.0)
MCH: 28.4 pg (ref 26.0–34.0)
MCHC: 34.2 g/dL (ref 30.0–36.0)
MCV: 83 fL (ref 78.0–100.0)
Platelets: 221 10*3/uL (ref 150–400)
RBC: 6.06 MIL/uL — AB (ref 4.22–5.81)
RDW: 13.7 % (ref 11.5–15.5)
WBC: 14.5 10*3/uL — ABNORMAL HIGH (ref 4.0–10.5)

## 2018-05-23 LAB — COMPREHENSIVE METABOLIC PANEL
ALBUMIN: 4 g/dL (ref 3.5–5.0)
ALK PHOS: 78 U/L (ref 38–126)
ALT: 23 U/L (ref 17–63)
ANION GAP: 13 (ref 5–15)
AST: 33 U/L (ref 15–41)
BUN: 22 mg/dL — ABNORMAL HIGH (ref 6–20)
CALCIUM: 9.2 mg/dL (ref 8.9–10.3)
CO2: 24 mmol/L (ref 22–32)
Chloride: 96 mmol/L — ABNORMAL LOW (ref 101–111)
Creatinine, Ser: 1.3 mg/dL — ABNORMAL HIGH (ref 0.61–1.24)
GFR calc non Af Amer: >60 mL/min (ref >60)
GLUCOSE: 105 mg/dL — AB (ref 65–99)
POTASSIUM: 3.8 mmol/L (ref 3.5–5.1)
SODIUM: 133 mmol/L — AB (ref 135–145)
TOTAL PROTEIN: 8.6 g/dL — AB (ref 6.5–8.1)
Total Bilirubin: 0.8 mg/dL (ref 0.3–1.2)

## 2018-05-23 LAB — URINALYSIS, ROUTINE W REFLEX MICROSCOPIC
BILIRUBIN URINE: NEGATIVE
GLUCOSE, UA: NEGATIVE mg/dL
HGB URINE DIPSTICK: NEGATIVE
Ketones, ur: 20 mg/dL — AB
Leukocytes, UA: NEGATIVE
NITRITE: NEGATIVE
PROTEIN: 100 mg/dL — AB
Specific Gravity, Urine: 1.038 — ABNORMAL HIGH (ref 1.005–1.030)
pH: 5 (ref 5.0–8.0)

## 2018-05-23 LAB — LIPASE, BLOOD: Lipase: 23 U/L (ref 11–51)

## 2018-05-23 MED ORDER — ONDANSETRON HCL 4 MG/2ML IJ SOLN
4.0000 mg | Freq: Once | INTRAMUSCULAR | Status: AC
  Administered 2018-05-23: 4 mg via INTRAVENOUS
  Filled 2018-05-23: qty 2

## 2018-05-23 MED ORDER — SODIUM CHLORIDE 0.9 % IV BOLUS
1000.0000 mL | Freq: Once | INTRAVENOUS | Status: AC
  Administered 2018-05-23: 1000 mL via INTRAVENOUS

## 2018-05-23 MED ORDER — IOPAMIDOL (ISOVUE-300) INJECTION 61%
100.0000 mL | Freq: Once | INTRAVENOUS | Status: AC | PRN
  Administered 2018-05-23: 100 mL via INTRAVENOUS

## 2018-05-23 MED ORDER — ONDANSETRON HCL 4 MG PO TABS: 4.0000 mg | ORAL_TABLET | Freq: Three times a day (TID) | ORAL | 0 refills | Status: DC | PRN

## 2018-05-23 MED ORDER — MORPHINE SULFATE (PF) 4 MG/ML IV SOLN
4.0000 mg | Freq: Once | INTRAVENOUS | Status: AC
  Administered 2018-05-23: 4 mg via INTRAVENOUS
  Filled 2018-05-23: qty 1

## 2018-05-23 MED ORDER — IOPAMIDOL (ISOVUE-300) INJECTION 61%
INTRAVENOUS | Status: AC
  Filled 2018-05-23: qty 100

## 2018-05-23 MED ORDER — ACETAMINOPHEN 325 MG PO TABS
650.0000 mg | ORAL_TABLET | Freq: Once | ORAL | Status: AC | PRN
  Administered 2018-05-23: 650 mg via ORAL
  Filled 2018-05-23: qty 2

## 2018-05-23 NOTE — ED Notes (Signed)
ADDITIONAL WARM COMPRESS GIVEN

## 2018-05-23 NOTE — ED Notes (Signed)
Pt has actively vomited 3 times since triage.

## 2018-05-23 NOTE — ED Notes (Signed)
ED Provider at bedside. PICKERING 

## 2018-05-23 NOTE — ED Provider Notes (Signed)
[facility]-EMERGENCY DEPT Provider Note   CSN: [id number] Arrival date & time: 05/23/18  1122     History   Chief Complaint Chief Complaint  Patient presents with  . Diarrhea  . Generalized Body Aches    HPI Michael Glover is a 32 y.o. male.  HPI Patient is a previously healthy 32 year old male who presents with 5 days of nausea, vomiting and diarrhea.  Patient reports that he initially had symptoms of congestion and rhinorrhea and headache.  He was treating symptoms with DayQuil.  Symptoms changed to vomiting and diarrhea for past 5 days. He has associated symptoms of diffuse abdominal pain, subjective fevers and chills. He denies any urinary complaints, penile discharge or rash. No sick contacts or recent travel. No blood per rectum.   Past Medical History:  Diagnosis Date  . Asthma   . Depression     Patient Active Problem List   Diagnosis Date Noted  . Nausea & vomiting 12/23/2011  . Depression 12/23/2011  . Abdominal pain 12/23/2011  . Black stool 12/23/2011    History reviewed. No pertinent surgical history.      Home Medications    Prior to Admission medications   Medication Sig Start Date End Date Taking? Authorizing Provider  diphenhydrAMINE-Phenylephrine (RA ALLERGY PLUS SINUS PO) Take 1 capsule by mouth 2 (two) times daily as needed (sinus congestion).   Yes [provider]  phenol (CHLORASEPTIC) 1.4 % LIQD Use as directed 1 spray in the mouth or throat as needed for throat irritation / pain.   Yes [provider]  ondansetron (ZOFRAN) 4 MG tablet Take 1 tablet (4 mg total) by mouth every 8 (eight) hours as needed for nausea or vomiting. 05/23/18   Wynelle Cleveland, MD    Family History No family history on file.  Social History Social History   Tobacco Use  . Smoking status: Current Every Day Smoker    Types: Cigarettes  Substance Use Topics  . Alcohol use: No  . Drug use: No     Allergies     Patient has no known allergies.   Review of Systems Review of Systems  Constitutional: Positive for chills and fever.  HENT: Positive for congestion and rhinorrhea. Negative for ear pain and sore throat.   Eyes: Negative for pain and visual disturbance.  Respiratory: Negative for cough and shortness of breath.   Cardiovascular: Negative for chest pain and palpitations.  Gastrointestinal: Positive for abdominal pain, diarrhea, nausea and vomiting.  Genitourinary: Negative for dysuria and hematuria.  Musculoskeletal: Negative for arthralgias and back pain.  Skin: Negative for color change and rash.  Neurological: Negative for seizures and syncope.  All other systems reviewed and are negative.    Physical Exam Updated Vital Signs BP (!) 136/94 (BP Location: Left Arm)   Pulse 81   Temp 99.3 F (37.4 C) (Oral)   Resp 18   Ht  (1.778 m)   Wt 83.9 kg (185 lb)   SpO2 99%   BMI 26.54 kg/m   Physical Exam  Constitutional: He appears well-developed and well-nourished.  HENT:  Head: Normocephalic and atraumatic.  Mouth/Throat: Mucous membranes are dry.  Eyes: Conjunctivae are normal.  Neck: Neck supple.  Cardiovascular: Normal rate and regular rhythm.  No murmur heard. Pulmonary/Chest: Effort normal and breath sounds normal. No respiratory distress.  Abdominal: Soft. There is tenderness (diffuse).  Musculoskeletal: He exhibits no edema.  Neurological: He is alert.  Skin: Skin is warm and dry.  Psychiatric: He has a normal mood and affect.  Nursing note and vitals reviewed.    ED Treatments / Results  Labs (all labs ordered are listed, but only abnormal results are displayed) Labs Reviewed  COMPREHENSIVE METABOLIC PANEL - Abnormal; Notable for the following components:      Result Value   Sodium 133 (*)    Chloride 96 (*)    Glucose, Bld 105 (*)    BUN 22 (*)    Creatinine, Ser 1.30 (*)    Total Protein 8.6 (*)    All other components within normal limits   CBC - Abnormal; Notable for the following components:   WBC 14.5 (*)    RBC 6.06 (*)    Hemoglobin 17.2 (*)    All other components within normal limits  URINALYSIS, ROUTINE W REFLEX MICROSCOPIC - Abnormal; Notable for the following components:   Color, Urine AMBER (*)    APPearance HAZY (*)    Specific Gravity, Urine 1.038 (*)    Ketones, ur 20 (*)    Protein, ur 100 (*)    Bacteria, UA RARE (*)    All other components within normal limits  LIPASE, BLOOD    EKG None  Radiology Ct Abdomen Pelvis W Contrast  Result Date: 05/23/2018 CLINICAL DATA:  Generalized body aching in diarrhea, 5 days duration. EXAM: CT ABDOMEN AND PELVIS WITH CONTRAST TECHNIQUE: Multidetector CT imaging of the abdomen and pelvis was performed using the standard protocol following bolus administration of intravenous contrast. CONTRAST:  ISOVUE-300 IOPAMIDOL (ISOVUE-300) INJECTION 61% COMPARISON:  12/21/2011 FINDINGS: Lower chest: Normal Hepatobiliary: No significant finding. 1 cm cyst adjacent to the false form ligament. Pancreas: Normal Spleen: Normal Adrenals/Urinary Tract: Adrenal glands are normal. Renal parenchyma appears normal. Question single 2 mm nonobstructing stone in each kidney. No ureteral stone. No stone in the bladder. Stomach/Bowel: No bowel abnormality by CT. No sign of obstruction. No sign of inflammatory disease. No mass or focal lesion evident. Vascular/Lymphatic: Normal Reproductive: Normal Other: No free fluid or air. Musculoskeletal: Normal IMPRESSION: No acute finding. No cause of the presenting symptoms is identified. The bowel appears normal by CT. Question 2 mm nonobstructing stone in each kidney. This would be incidental to the clinical presentation. Electronically Signed   By: Paulina Fusi M.D.   On: 05/23/2018 19:58    Procedures Procedures (including critical care time)  Medications Ordered in ED Medications  acetaminophen (TYLENOL) tablet 650 mg (650 mg Oral Given 05/23/18  1448)  sodium chloride 0.9 % bolus 1,000 mL (0 mLs Intravenous Stopped 05/23/18 1726)  ondansetron (ZOFRAN) injection 4 mg (4 mg Intravenous Given 05/23/18 1710)  morphine 4 MG/ML injection 4 mg (4 mg Intravenous Given 05/23/18 1711)  sodium chloride 0.9 % bolus 1,000 mL (0 mLs Intravenous Stopped 05/23/18 1726)  morphine 4 MG/ML injection 4 mg (4 mg Intravenous Given 05/23/18 1848)  iopamidol (ISOVUE-300) 61 % injection 100 mL (100 mLs Intravenous Contrast Given 05/23/18 1943)     Initial Impression / Assessment and Plan / ED Course  I have reviewed the triage vital signs and the nursing notes.  Pertinent labs & imaging results that were available during my care of the patient were reviewed by me and considered in my medical decision making (see chart for details).    Patient is a 32 year old male with history of 1 week of nausea, vomiting, and diarrhea.  Febrile on arrival, mild distress with exam as above.  Labs as above, mild leukocytosis, ketones in urine with  mild elevation of creatinine elevated to 1.3 from 1.0.  Patient treated with IV fluids, pain and nausea medications.    On repeat abdominal exam, patient with recurrent tenderness and persistent nausea.  CT abdomen/pelvis obtained, negative for acute findings.   History and physical exam consistent with viral process at this time.  Safe for discharge home at this time.  Strict return precautions discussed.  Patient and plan of care discussed with Attending physician, Dr. Rubin Payor.    Final Clinical Impressions(s) / ED Diagnoses   Final diagnoses:  Abdominal pain, vomiting, and diarrhea    ED Discharge Orders        Ordered    ondansetron (ZOFRAN) 4 MG tablet  Every 8 hours PRN     05/23/18 2011       Wynelle Cleveland, MD 05/23/18 2013    Benjiman Core, MD 05/24/18 0002

## 2018-05-23 NOTE — Discharge Instructions (Addendum)
Continue to drink plenty of fluids

## 2018-05-23 NOTE — ED Notes (Signed)
Pt has been asking for something to drink. Informed Pt since he is vomiting and nauseous not to drink anything. Pt is walking in lobby and drinking from water fountain and keeps vomiting.

## 2018-05-23 NOTE — ED Notes (Signed)
ED Provider at bedside. 

## 2018-05-23 NOTE — ED Triage Notes (Signed)
Pt complains of generalized body aches, diarrhea for the past 5 days. Pt also states he had been having difficulty controlling his bowels.

## 2019-04-09 ENCOUNTER — Emergency Department (HOSPITAL_COMMUNITY)
Admission: EM | Admit: 2019-04-09 | Discharge: 2019-04-09 | Payer: Self-pay | Attending: Emergency Medicine | Admitting: Emergency Medicine

## 2019-04-09 ENCOUNTER — Encounter (HOSPITAL_COMMUNITY): Payer: Self-pay

## 2019-04-09 ENCOUNTER — Other Ambulatory Visit: Payer: Self-pay

## 2019-04-09 DIAGNOSIS — F17210 Nicotine dependence, cigarettes, uncomplicated: Secondary | ICD-10-CM | POA: Insufficient documentation

## 2019-04-09 DIAGNOSIS — Z79899 Other long term (current) drug therapy: Secondary | ICD-10-CM | POA: Insufficient documentation

## 2019-04-09 DIAGNOSIS — Z036 Encounter for observation for suspected toxic effect from ingested substance ruled out: Secondary | ICD-10-CM | POA: Insufficient documentation

## 2019-04-09 LAB — URINALYSIS, ROUTINE W REFLEX MICROSCOPIC
Bilirubin Urine: NEGATIVE
Glucose, UA: NEGATIVE mg/dL
Hgb urine dipstick: NEGATIVE
Ketones, ur: NEGATIVE mg/dL
Leukocytes,Ua: NEGATIVE
Nitrite: NEGATIVE
Protein, ur: NEGATIVE mg/dL
Specific Gravity, Urine: 1.016 (ref 1.005–1.030)
pH: 8 (ref 5.0–8.0)

## 2019-04-09 LAB — CBC WITH DIFFERENTIAL/PLATELET
Abs Immature Granulocytes: 0.02 10*3/uL (ref 0.00–0.07)
Basophils Absolute: 0 10*3/uL (ref 0.0–0.1)
Basophils Relative: 0 %
Eosinophils Absolute: 0.2 10*3/uL (ref 0.0–0.5)
Eosinophils Relative: 3 %
HCT: 46.9 % (ref 39.0–52.0)
Hemoglobin: 14.6 g/dL (ref 13.0–17.0)
Immature Granulocytes: 0 %
Lymphocytes Relative: 52 %
Lymphs Abs: 3.6 10*3/uL (ref 0.7–4.0)
MCH: 27.8 pg (ref 26.0–34.0)
MCHC: 31.1 g/dL (ref 30.0–36.0)
MCV: 89.2 fL (ref 80.0–100.0)
Monocytes Absolute: 0.5 10*3/uL (ref 0.1–1.0)
Monocytes Relative: 7 %
Neutro Abs: 2.6 10*3/uL (ref 1.7–7.7)
Neutrophils Relative %: 38 %
Platelets: 226 10*3/uL (ref 150–400)
RBC: 5.26 MIL/uL (ref 4.22–5.81)
RDW: 13.3 % (ref 11.5–15.5)
WBC: 7 10*3/uL (ref 4.0–10.5)
nRBC: 0 % (ref 0.0–0.2)

## 2019-04-09 LAB — BASIC METABOLIC PANEL
Anion gap: 6 (ref 5–15)
BUN: 18 mg/dL (ref 6–20)
CO2: 24 mmol/L (ref 22–32)
Calcium: 9.4 mg/dL (ref 8.9–10.3)
Chloride: 108 mmol/L (ref 98–111)
Creatinine, Ser: 1.04 mg/dL (ref 0.61–1.24)
GFR calc Af Amer: >60 mL/min (ref >60)
GFR calc non Af Amer: >60 mL/min (ref >60)
Glucose, Bld: 104 mg/dL — ABNORMAL HIGH (ref 70–99)
Potassium: 3.9 mmol/L (ref 3.5–5.1)
Sodium: 138 mmol/L (ref 135–145)

## 2019-04-09 LAB — RAPID URINE DRUG SCREEN, HOSP PERFORMED
Amphetamines: NOT DETECTED
Barbiturates: NOT DETECTED
Benzodiazepines: NOT DETECTED
Cocaine: POSITIVE — AB
Opiates: NOT DETECTED
Tetrahydrocannabinol: POSITIVE — AB

## 2019-04-09 LAB — ETHANOL: Alcohol, Ethyl (B): <10 mg/dL (ref <10)

## 2019-04-09 LAB — ACETAMINOPHEN LEVEL
Acetaminophen (Tylenol), Serum: <10 ug/mL — ABNORMAL LOW (ref 10–30)
Acetaminophen (Tylenol), Serum: <10 ug/mL — ABNORMAL LOW (ref 10–30)

## 2019-04-09 LAB — SALICYLATE LEVEL: Salicylate Lvl: <7 mg/dL (ref 2.8–30.0)

## 2019-04-09 MED ORDER — CHARCOAL ACTIVATED PO LIQD
50.0000 g | Freq: Once | ORAL | Status: AC
  Administered 2019-04-09: 50 g via ORAL
  Filled 2019-04-09: qty 240

## 2019-04-09 MED ORDER — ACETAMINOPHEN 500 MG PO TABS
1000.0000 mg | ORAL_TABLET | Freq: Once | ORAL | Status: DC
  Filled 2019-04-09: qty 2

## 2019-04-09 MED ORDER — ONDANSETRON HCL 4 MG/2ML IJ SOLN
4.0000 mg | Freq: Once | INTRAMUSCULAR | Status: AC
  Administered 2019-04-09: 4 mg via INTRAVENOUS
  Filled 2019-04-09: qty 2

## 2019-04-09 NOTE — ED Notes (Signed)
Bed: WA07 Expected date:  Expected time:  Means of arrival:  Comments: EMS 33yo heroin swallowed

## 2019-04-09 NOTE — ED Notes (Signed)
Pt aware we need urine specimen.  

## 2019-04-09 NOTE — ED Provider Notes (Signed)
[facility]-EMERGENCY DEPT Provider Note   CSN: [id number] Arrival date & time: 04/09/19  1718    History   Chief Complaint No chief complaint on file.   HPI Michael Glover is a 33 y.o. male.     33 year old male with prior medical history as detailed below presents for evaluation of possible overdose.  Patient is in police custody.  Police report that the patient was being arrested for dealing illicit substances.  During the arrest the patient may have swallowed a baggy containing some type of illicit drug.  The patient initially reported that the baggy contained Percocet.  His story is now evolved and he reports that the bag contained heroin.  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  He denies current chest pain or shortness of breath.  He is alert.  He is an uncooperative patient.  The history is provided by the patient, medical records and the police.  Illness  Location:  Reported possible ingestion of illicit substance  Severity:  Mild Onset quality:  Sudden Duration:  1 hour Timing:  Unable to specify Progression:  Unchanged Chronicity:  New   Past Medical History:  Diagnosis Date  . Asthma   . Depression     Patient Active Problem List   Diagnosis Date Noted  . Nausea & vomiting 12/23/2011  . Depression 12/23/2011  . Abdominal pain 12/23/2011  . Black stool 12/23/2011    History reviewed. No pertinent surgical history.      Home Medications    Prior to Admission medications   Medication Sig Start Date End Date Taking? Authorizing Provider  diphenhydrAMINE-Phenylephrine (RA ALLERGY PLUS SINUS PO) Take 1 capsule by mouth 2 (two) times daily as needed (sinus congestion).    [provider]  ondansetron (ZOFRAN) 4 MG tablet Take 1 tablet (4 mg total) by mouth every 8 (eight) hours as needed for nausea or vomiting. 05/23/18   Wynelle Cleveland, MD  phenol (CHLORASEPTIC) 1.4 % LIQD Use as directed 1 spray in the  mouth or throat as needed for throat irritation / pain.    [provider]    Family History History reviewed. No pertinent family history.  Social History Social History   Tobacco Use  . Smoking status: Current Every Day Smoker    Types: Cigarettes  Substance Use Topics  . Alcohol use: No  . Drug use: No     Allergies   Patient has no known allergies.   Review of Systems Review of Systems  All other systems reviewed and are negative.    Physical Exam Updated Vital Signs BP 127/81   Pulse 88   Temp 98.1 F (36.7 C) (Oral)   Resp 14   SpO2 100%   Physical Exam Vitals signs and nursing note reviewed.  Constitutional:      General: He is not in acute distress.    Appearance: Normal appearance. He is well-developed.     Comments: In police custody   HENT:     Head: Normocephalic and atraumatic.  Eyes:     Conjunctiva/sclera: Conjunctivae normal.     Pupils: Pupils are equal, round, and reactive to light.  Neck:     Musculoskeletal: Normal range of motion and neck supple.  Cardiovascular:     Rate and Rhythm: Normal rate and regular rhythm.     Heart sounds: Normal heart sounds.  Pulmonary:     Effort: Pulmonary effort is normal. No respiratory distress.  Breath sounds: Normal breath sounds.  Abdominal:     General: There is no distension.     Palpations: Abdomen is soft.     Tenderness: There is no abdominal tenderness.  Musculoskeletal: Normal range of motion.        General: No deformity.  Skin:    General: Skin is warm and dry.  Neurological:     Mental Status: He is alert and oriented to person, place, and time.      ED Treatments / Results  Labs (all labs ordered are listed, but only abnormal results are displayed) Labs Reviewed  BASIC METABOLIC PANEL - Abnormal; Notable for the following components:      Result Value   Glucose, Bld 104 (*)    All other components within normal limits  RAPID URINE DRUG SCREEN, HOSP PERFORMED -  Abnormal; Notable for the following components:   Cocaine POSITIVE (*)    Tetrahydrocannabinol POSITIVE (*)    All other components within normal limits  ACETAMINOPHEN LEVEL - Abnormal; Notable for the following components:   Acetaminophen (Tylenol), Serum <10 (*)    All other components within normal limits  ETHANOL  CBC WITH DIFFERENTIAL/PLATELET  URINALYSIS, ROUTINE W REFLEX MICROSCOPIC  SALICYLATE LEVEL  ACETAMINOPHEN LEVEL    EKG EKG Interpretation  Date/Time:  Wednesday April 09 2019 17:42:18 EDT Ventricular Rate:  76 PR Interval:    QRS Duration: 95 QT Interval:  343 QTC Calculation: 386 R Axis:   8 Text Interpretation:  Sinus rhythm ST elev, probable normal early repol pattern Confirmed by Kristine RoyalMessick, Genene Kilman [phone] on 04/09/2019 5:56:33 PM   Radiology No results found.  Procedures Procedures (including critical care time)  Medications Ordered in ED Medications  ondansetron (ZOFRAN) injection 4 mg (4 mg Intravenous Given 04/09/19 1747)  charcoal activated (NO SORBITOL) (ACTIDOSE-AQUA) suspension 50 g (50 g Oral Given 04/09/19 1800)     Initial Impression / Assessment and Plan / ED Course  I have reviewed the triage vital signs and the nursing notes.  Pertinent labs & imaging results that were available during my care of the patient were reviewed by me and considered in my medical decision making (see chart for details).          1922 - Re-evaluation - patient alert, comfortable, smiling. NAD.     MDM  Screen complete  Patient is presenting for evaluation of possible toxic ingestion.  Patient without clear evidence of actual ingestion.  He is currently in police custody.  Poison control contacted.  Recommendations include screening labs and observation.  Following a period of observation the patient is without evidence of any significant toxidrome.  Patient's mental status has remained normal.  Screening labs obtained are without significant  abnormality.  Of note, patient's tox screen was negative for narcotics.  Patient appears comfortable at time of discharge. He is released into custody of law enforcement.      Final Clinical Impressions(s) / ED Diagnoses   Final diagnoses:  Encounter for observation for suspected toxic effect from ingested substance    ED Discharge Orders    None       Wynetta FinesMessick, Gaylyn Berish C, MD 04/09/19 2121

## 2019-04-09 NOTE — ED Triage Notes (Signed)
Pt BIB EMS and [employer] from home. Pt reports snorting heroin earlier today. Pt also swallowed a bag of heroin but will not admit how much was in bag. Pt A&O x4.  BP 134/84 HR 84 RR 16 95% RA Temp 98.2

## 2019-04-09 NOTE — ED Notes (Signed)
Pt discharged in police custody

## 2019-04-09 NOTE — Discharge Instructions (Signed)
Please return for any problem.  Follow-up with your regular care provider as instructed. °

## 2019-04-09 NOTE — ED Notes (Signed)
Poison control recommendations: give charcoal, check Acetaminophen levels now and 4 hours later, monitor for side effects, and give narcan/other medications as needed

## 2019-07-27 ENCOUNTER — Emergency Department (HOSPITAL_COMMUNITY)

## 2019-07-27 ENCOUNTER — Other Ambulatory Visit: Payer: Self-pay

## 2019-07-27 ENCOUNTER — Emergency Department (HOSPITAL_COMMUNITY)
Admission: EM | Admit: 2019-07-27 | Discharge: 2019-07-27 | Attending: Emergency Medicine | Admitting: Emergency Medicine

## 2019-07-27 ENCOUNTER — Encounter (HOSPITAL_COMMUNITY): Payer: Self-pay

## 2019-07-27 DIAGNOSIS — M542 Cervicalgia: Secondary | ICD-10-CM | POA: Diagnosis not present

## 2019-07-27 DIAGNOSIS — J45909 Unspecified asthma, uncomplicated: Secondary | ICD-10-CM | POA: Insufficient documentation

## 2019-07-27 DIAGNOSIS — M545 Low back pain, unspecified: Secondary | ICD-10-CM

## 2019-07-27 DIAGNOSIS — F17210 Nicotine dependence, cigarettes, uncomplicated: Secondary | ICD-10-CM | POA: Insufficient documentation

## 2019-07-27 DIAGNOSIS — R51 Headache: Secondary | ICD-10-CM | POA: Diagnosis not present

## 2019-07-27 DIAGNOSIS — W19XXXA Unspecified fall, initial encounter: Secondary | ICD-10-CM

## 2019-07-27 MED ORDER — IBUPROFEN 600 MG PO TABS: 600.0000 mg | ORAL_TABLET | Freq: Four times a day (QID) | ORAL | 0 refills | Status: DC | PRN

## 2019-07-27 MED ORDER — IBUPROFEN 800 MG PO TABS
800.0000 mg | ORAL_TABLET | Freq: Once | ORAL | Status: AC
  Administered 2019-07-27: 800 mg via ORAL
  Filled 2019-07-27: qty 1

## 2019-07-27 NOTE — ED Triage Notes (Signed)
Pt BIBA from jail. Pt was laying in bed and slipped 1 ft to floor. Pt c/o neck and thoracic back pain.  Pt had BM on self, but is able to ambulate, move all 4 limbs, etc.

## 2019-07-27 NOTE — Discharge Instructions (Signed)
Return if any problems.

## 2019-07-27 NOTE — ED Notes (Signed)
Pt states he want to clean up due to bowel movement and urinating on himself, Careers adviser states a new jump suit will come at 1900 and he can clean himself up then.

## 2019-07-28 NOTE — ED Provider Notes (Signed)
[facility]-EMERGENCY DEPT Provider Note   CSN: [id number] Arrival date & time: 07/27/19  1547     History   Chief Complaint Chief Complaint  Patient presents with   Fall    HPI Michael Glover is a 33 y.o. male.     Pt reports he [facility].  Pt reports he hit his head on concrete floor.  Pt reports he lost control of bowel and urine. Pt reports jail cell was flooding. Pt complains of pain in his full spine.  Pt complains of a headache and neck pain  The history is provided by the patient. No language interpreter was used.  Fall This is a new problem. The problem occurs constantly. The problem has not changed since onset.Nothing aggravates the symptoms. Nothing relieves the symptoms. He has tried nothing for the symptoms.    Past Medical History:  Diagnosis Date   Asthma    Depression     Patient Active Problem List   Diagnosis Date Noted   Nausea & vomiting 12/23/2011   Depression 12/23/2011   Abdominal pain 12/23/2011   Black stool 12/23/2011    History reviewed. No pertinent surgical history.      Home Medications    Prior to Admission medications   Medication Sig Start Date End Date Taking? Authorizing Provider  ibuprofen (ADVIL) 600 MG tablet Take 1 tablet (600 mg total) by mouth every 6 (six) hours as needed. 07/27/19   Elson AreasSofia, Kingdavid Leinbach K, PA-C    Family History No family history on file.  Social History Social History   Tobacco Use   Smoking status: Current Every Day Smoker    Types: Cigarettes  Substance Use Topics   Alcohol use: No   Drug use: No     Allergies   Patient has no known allergies.   Review of Systems Review of Systems  All other systems reviewed and are negative.    Physical Exam Updated Vital Signs BP (!) 159/99 (BP Location: Right Arm)    Pulse 71    Temp 98.5 F (36.9 C) (Oral)    Resp 14    Wt 84 kg    SpO2 100%    BMI 26.57 kg/m   Physical Exam Vitals signs  and nursing note reviewed.  Constitutional:      Appearance: Normal appearance. He is well-developed.  HENT:     Head: Normocephalic and atraumatic.     Comments: Tender occipital scalp      Right Ear: External ear normal.     Left Ear: External ear normal.     Nose: Nose normal.     Mouth/Throat:     Mouth: Mucous membranes are moist.  Eyes:     Conjunctiva/sclera: Conjunctivae normal.  Neck:     Musculoskeletal: Neck supple.  Cardiovascular:     Rate and Rhythm: Normal rate and regular rhythm.     Heart sounds: No murmur.  Pulmonary:     Effort: Pulmonary effort is normal. No respiratory distress.     Breath sounds: Normal breath sounds.  Abdominal:     Palpations: Abdomen is soft.     Tenderness: There is no abdominal tenderness.  Musculoskeletal:     Comments: Diffuse cervical through lumbar spine,    Skin:    General: Skin is warm and dry.  Neurological:     Mental Status: He is alert.      ED Treatments / Results  Labs (all labs  ordered are listed, but only abnormal results are displayed) Labs Reviewed - No data to display  EKG None  Radiology Dg Thoracic Spine 2 View  Result Date: 07/27/2019 CLINICAL DATA:  Pain following fall EXAM: THORACIC SPINE 2 VIEWS COMPARISON:  None. FINDINGS: Frontal and lateral views obtained. No evident fracture or spondylolisthesis. Disc spaces appear normal. No erosive change or paraspinous lesion. Visualized lungs clear. IMPRESSION: No fracture or spondylolisthesis.  No evident arthropathy. Electronically Signed   By: Lowella Grip III M.D.   On: 07/27/2019 18:18   Dg Lumbar Spine Complete  Result Date: 07/27/2019 CLINICAL DATA:  Pain following fall EXAM: LUMBAR SPINE - COMPLETE 4+ VIEW COMPARISON:  None. FINDINGS: Frontal, lateral, spot lumbosacral lateral, and bilateral oblique views were obtained. Study limited due to overlying shackles. There are 5 non rib-bearing lumbar type vertebral bodies. No fracture or spondylolisthesis  is evident on this somewhat limited study. There is no appreciable disc space narrowing or evident facet arthropathy. IMPRESSION: Limited study due to overlying metallic artifact. No demonstrable fracture or spondylolisthesis. No appreciable underlying arthropathy. Electronically Signed   By: Lowella Grip III M.D.   On: 07/27/2019 18:19   Ct Head Wo Contrast  Result Date: 07/27/2019 CLINICAL DATA:  Pain following fall EXAM: CT HEAD WITHOUT CONTRAST CT CERVICAL SPINE WITHOUT CONTRAST TECHNIQUE: Multidetector CT imaging of the head and cervical spine was performed following the standard protocol without intravenous contrast. Multiplanar CT image reconstructions of the cervical spine were also generated. COMPARISON:  September 05, 2011 FINDINGS: CT HEAD FINDINGS Brain: Ventricles are normal in size and configuration. There is no intracranial mass, hemorrhage, extra-axial fluid collection, or midline shift. Brain parenchyma appears unremarkable. No acute infarct evident. Vascular: No hyperdense vessel.  No evident vascular calcification. Skull: The bony calvarium appears intact. Sinuses/Orbits: Visualized paranasal sinuses are clear. Orbits appear symmetric bilaterally. Other: Mastoid air cells are clear. CT CERVICAL SPINE FINDINGS Alignment: There is no demonstrable spondylolisthesis. Skull base and vertebrae: Skull base and craniocervical junction regions appear normal. No fracture evident. No blastic or lytic bone lesions. Soft tissues and spinal canal: Prevertebral soft tissues and predental space regions are normal. There is no cord or canal hematoma. No paraspinous lesion. Disc levels: Disc spaces appear unremarkable. There is no evident nerve root edema or effacement. No disc extrusion or stenosis. No appreciable facet arthropathy. Upper chest: Visualized upper lung regions are clear. Other: None IMPRESSION: CT head: Study within normal limits. CT cervical spine: No fracture or spondylolisthesis. No  appreciable arthropathy. No nerve root edema or effacement. No disc extrusion or stenosis. Electronically Signed   By: Lowella Grip III M.D.   On: 07/27/2019 18:08   Ct Cervical Spine Wo Contrast  Result Date: 07/27/2019 CLINICAL DATA:  Pain following fall EXAM: CT HEAD WITHOUT CONTRAST CT CERVICAL SPINE WITHOUT CONTRAST TECHNIQUE: Multidetector CT imaging of the head and cervical spine was performed following the standard protocol without intravenous contrast. Multiplanar CT image reconstructions of the cervical spine were also generated. COMPARISON:  September 05, 2011 FINDINGS: CT HEAD FINDINGS Brain: Ventricles are normal in size and configuration. There is no intracranial mass, hemorrhage, extra-axial fluid collection, or midline shift. Brain parenchyma appears unremarkable. No acute infarct evident. Vascular: No hyperdense vessel.  No evident vascular calcification. Skull: The bony calvarium appears intact. Sinuses/Orbits: Visualized paranasal sinuses are clear. Orbits appear symmetric bilaterally. Other: Mastoid air cells are clear. CT CERVICAL SPINE FINDINGS Alignment: There is no demonstrable spondylolisthesis. Skull base and vertebrae: Skull base and craniocervical  junction regions appear normal. No fracture evident. No blastic or lytic bone lesions. Soft tissues and spinal canal: Prevertebral soft tissues and predental space regions are normal. There is no cord or canal hematoma. No paraspinous lesion. Disc levels: Disc spaces appear unremarkable. There is no evident nerve root edema or effacement. No disc extrusion or stenosis. No appreciable facet arthropathy. Upper chest: Visualized upper lung regions are clear. Other: None IMPRESSION: CT head: Study within normal limits. CT cervical spine: No fracture or spondylolisthesis. No appreciable arthropathy. No nerve root edema or effacement. No disc extrusion or stenosis. Electronically Signed   By: Bretta BangWilliam  Woodruff III M.D.   On: 07/27/2019 18:08      Procedures Procedures (including critical care time)  Medications Ordered in ED Medications  ibuprofen (ADVIL) tablet 800 mg (800 mg Oral Given 07/27/19 1852)     Initial Impression / Assessment and Plan / ED Course  I have reviewed the triage vital signs and the nursing notes.  Pertinent labs & imaging results that were available during my care of the patient were reviewed by me and considered in my medical decision making (see chart for details).        MDM:   I discussed the pt with Dr. Ranae PalmsYelverton.  Ct head and c spine are normal.  Xray thoracic and lumbar spine are normal.  Pt given ibuprofen for headache.  rx for ibuprofen   Final Clinical Impressions(s) / ED Diagnoses   Final diagnoses:  Fall, initial encounter  Low back pain without sciatica, unspecified back pain laterality, unspecified chronicity    ED Discharge Orders         Ordered    ibuprofen (ADVIL) 600 MG tablet  Every 6 hours PRN     07/27/19 1850        An After Visit Summary was printed and given to the patient.    Elson AreasSofia, Donterius Filley K, Cordelia Poche-C 07/28/19 2251    Bethann BerkshireZammit, Joseph, MD 08/01/19 [phone]

## 2019-09-12 ENCOUNTER — Emergency Department (HOSPITAL_BASED_OUTPATIENT_CLINIC_OR_DEPARTMENT_OTHER)
Admission: EM | Admit: 2019-09-12 | Discharge: 2019-09-12 | Disposition: A | Discharge status: Home / Self Care | Attending: Emergency Medicine | Admitting: Emergency Medicine

## 2019-09-12 ENCOUNTER — Other Ambulatory Visit: Payer: Self-pay

## 2019-09-12 ENCOUNTER — Encounter (HOSPITAL_BASED_OUTPATIENT_CLINIC_OR_DEPARTMENT_OTHER): Payer: Self-pay | Admitting: Emergency Medicine

## 2019-09-12 DIAGNOSIS — S0001XA Abrasion of scalp, initial encounter: Secondary | ICD-10-CM | POA: Insufficient documentation

## 2019-09-12 DIAGNOSIS — F17210 Nicotine dependence, cigarettes, uncomplicated: Secondary | ICD-10-CM | POA: Insufficient documentation

## 2019-09-12 DIAGNOSIS — Y939 Activity, unspecified: Secondary | ICD-10-CM | POA: Insufficient documentation

## 2019-09-12 DIAGNOSIS — M25511 Pain in right shoulder: Secondary | ICD-10-CM | POA: Insufficient documentation

## 2019-09-12 DIAGNOSIS — J45909 Unspecified asthma, uncomplicated: Secondary | ICD-10-CM | POA: Insufficient documentation

## 2019-09-12 DIAGNOSIS — X58XXXA Exposure to other specified factors, initial encounter: Secondary | ICD-10-CM | POA: Insufficient documentation

## 2019-09-12 DIAGNOSIS — T148XXA Other injury of unspecified body region, initial encounter: Secondary | ICD-10-CM | POA: Insufficient documentation

## 2019-09-12 DIAGNOSIS — M25512 Pain in left shoulder: Secondary | ICD-10-CM | POA: Insufficient documentation

## 2019-09-12 DIAGNOSIS — Y92149 Unspecified place in prison as the place of occurrence of the external cause: Secondary | ICD-10-CM | POA: Insufficient documentation

## 2019-09-12 DIAGNOSIS — Y999 Unspecified external cause status: Secondary | ICD-10-CM | POA: Insufficient documentation

## 2019-09-12 DIAGNOSIS — M542 Cervicalgia: Secondary | ICD-10-CM | POA: Insufficient documentation

## 2019-09-12 DIAGNOSIS — R519 Headache, unspecified: Secondary | ICD-10-CM

## 2019-09-12 MED ORDER — IBUPROFEN 600 MG PO TABS: 600.0000 mg | ORAL_TABLET | Freq: Four times a day (QID) | ORAL | 0 refills | Status: AC | PRN

## 2019-09-12 MED ORDER — CEPHALEXIN 500 MG PO CAPS: 500.0000 mg | ORAL_CAPSULE | Freq: Four times a day (QID) | ORAL | 0 refills | Status: DC

## 2019-09-12 MED ORDER — CYCLOBENZAPRINE HCL 10 MG PO TABS: 10.0000 mg | ORAL_TABLET | Freq: Two times a day (BID) | ORAL | 0 refills | Status: DC | PRN

## 2019-09-12 NOTE — ED Provider Notes (Signed)
[facility] EMERGENCY DEPARTMENT Provider Note   CSN: [id number] Arrival date & time: 09/12/19  1856     History   Chief Complaint Chief Complaint  Patient presents with  . Head Injury    HPI Michael Glover is a 33 y.o. male.     Patient presents the emergency department with complaint of swelling on the back of his head with some drainage as well as muscle pain in his neck and shoulders.  Patient states that he was in jail and had a head injury on 07/27/2019.  Patient was seen in the emergency department and had negative imaging of his head, cervical spine, thoracic spine, lumbar spine.  Patient states that he was treated with ibuprofen and Tylenol.  He was also on antibiotics at one point.  He states that the area is improved however he has noted increased swelling to the back of his head and is noted blood and pus on his pillow.  He also has muscle pain in the back of his head, neck and posterior shoulders bilaterally which make it difficult for him to sleep.  Pain is worse with movement.  He denies fever, nausea or vomiting.  He denies vision changes, trouble walking, weakness in his arms or legs.  He states that he is on psychiatric medications which she continues to take.  He follows up with a psychiatrist for this.  He is on medicine to help him sleep however continues to have difficulty sleeping.     Past Medical History:  Diagnosis Date  . Asthma   . Depression     Patient Active Problem List   Diagnosis Date Noted  . Nausea & vomiting 12/23/2011  . Depression 12/23/2011  . Abdominal pain 12/23/2011  . Black stool 12/23/2011    History reviewed. No pertinent surgical history.      Home Medications    Prior to Admission medications   Medication Sig Start Date End Date Taking? Authorizing Provider  cephALEXin (KEFLEX) 500 MG capsule Take 1 capsule (500 mg total) by mouth 4 (four) times daily. 09/12/19   Carlisle Cater, PA-C  cyclobenzaprine  (FLEXERIL) 10 MG tablet Take 1 tablet (10 mg total) by mouth 2 (two) times daily as needed for muscle spasms. 09/12/19   Carlisle Cater, PA-C  ibuprofen (ADVIL) 600 MG tablet Take 1 tablet (600 mg total) by mouth every 6 (six) hours as needed. 09/12/19   Carlisle Cater, PA-C    Family History No family history on file.  Social History Social History   Tobacco Use  . Smoking status: Current Every Day Smoker    Types: Cigarettes  . Smokeless tobacco: Never Used  Substance Use Topics  . Alcohol use: No  . Drug use: No     Allergies   Patient has no known allergies.   Review of Systems Review of Systems  Constitutional: Negative for chills and fever.  Gastrointestinal: Negative for nausea and vomiting.  Musculoskeletal: Positive for myalgias and neck pain.  Skin: Negative for color change.       Negative for abscess  Hematological: Negative for adenopathy.     Physical Exam Updated Vital Signs BP 125/83 (BP Location: Right Arm)   Pulse 84   Temp 98.5 F (36.9 C) (Oral)   Resp 20   SpO2 99%   Physical Exam Vitals signs and nursing note reviewed.  Constitutional:      Appearance: He is well-developed.  HENT:     Head: Normocephalic and atraumatic.  Comments: There is a small abrasion to the right occipital scalp without significant erythema or warmth.  Inferior to this there is a small, minimally tender, mobile occipital lymph node. Eyes:     General:        Right eye: No discharge.        Left eye: No discharge.     Conjunctiva/sclera: Conjunctivae normal.  Neck:     Musculoskeletal: Normal range of motion and neck supple.  Cardiovascular:     Rate and Rhythm: Normal rate and regular rhythm.     Heart sounds: Normal heart sounds.  Pulmonary:     Effort: Pulmonary effort is normal.     Breath sounds: Normal breath sounds.  Abdominal:     Palpations: Abdomen is soft.     Tenderness: There is no abdominal tenderness.  Musculoskeletal:     Right shoulder:  He exhibits tenderness and spasm. He exhibits normal range of motion and no bony tenderness.     Left shoulder: He exhibits tenderness and spasm. He exhibits normal range of motion and no bony tenderness.     Cervical back: He exhibits tenderness. He exhibits normal range of motion and no bony tenderness.     Thoracic back: Normal.     Lumbar back: Normal.  Skin:    General: Skin is warm and dry.  Neurological:     Mental Status: He is alert.  Psychiatric:        Mood and Affect: Affect is flat.      ED Treatments / Results  Labs (all labs ordered are listed, but only abnormal results are displayed) Labs Reviewed - No data to display  EKG None  Radiology No results found.  Procedures Procedures (including critical care time)  Medications Ordered in ED Medications - No data to display   Initial Impression / Assessment and Plan / ED Course  I have reviewed the triage vital signs and the nursing notes.  Pertinent labs & imaging results that were available during my care of the patient were reviewed by me and considered in my medical decision making (see chart for details).        Patient seen and examined.  Reviewed imaging results from visit on 07/27/2019.  Vital signs reviewed and are as follows: BP 125/83 (BP Location: Right Arm)   Pulse 84   Temp 98.5 F (36.9 C) (Oral)   Resp 20   SpO2 99%   Occipital swelling: Patient has a small abrasion without severe cellulitis.  He has a reactive occipital lymph node.  This is minimally tender.  Will place patient on Keflex to treat mild infection.  No abscess noted which would require drainage.  Neck and posterior shoulder tenderness: Consistent with muscle spasm.  Patient with mild tenderness to palpation in these areas.  Will give muscle relaxer to take at bedtime.  Otherwise we will treat with continued ibuprofen.  Patient counseled on proper use of muscle relaxant medication.  They were told not to drink alcohol,  drive any vehicle, or do any dangerous activities while taking this medication.  Patient verbalized understanding.   Final Clinical Impressions(s) / ED Diagnoses   Final diagnoses:  Occipital pain  Muscle strain   As above. Pt appears well. No severe infection.   ED Discharge Orders         Ordered    cyclobenzaprine (FLEXERIL) 10 MG tablet  2 times daily PRN     09/12/19 2108    cephALEXin (KEFLEX) 500  MG capsule  4 times daily     09/12/19 2108    ibuprofen (ADVIL) 600 MG tablet  Every 6 hours PRN     09/12/19 2108           Renne Crigler, Cordelia Poche 09/12/19 2115    Tilden Fossa, MD 09/13/19 1258

## 2019-09-12 NOTE — Discharge Instructions (Signed)
Please read and follow all provided instructions.  Your diagnoses today include:  1. Occipital pain   2. Muscle strain     Tests performed today include:  Vital signs. See below for your results today.   Medications prescribed:   Ibuprofen (Motrin, Advil) - anti-inflammatory pain medication  Do not exceed 600mg  ibuprofen every 6 hours, take with food  You have been prescribed an anti-inflammatory medication or NSAID. Take with food. Take smallest effective dose for the shortest duration needed for your pain. Stop taking if you experience stomach pain or vomiting.    Flexeril (cyclobenzaprine) - muscle relaxer medication  DO NOT drive or perform any activities that require you to be awake and alert because this medicine can make you drowsy.    Keflex (cephalexin) - antibiotic  You have been prescribed an antibiotic medicine: take the entire course of medicine even if you are feeling better. Stopping early can cause the antibiotic not to work.  Take any prescribed medications only as directed.  Home care instructions:  Follow any educational materials contained in this packet.  BE VERY CAREFUL not to take multiple medicines containing Tylenol (also called acetaminophen). Doing so can lead to an overdose which can damage your liver and cause liver failure and possibly death.   Follow-up instructions: Please follow-up with your primary care provider in the next 3 days for further evaluation of your symptoms.   Return instructions:  SEEK IMMEDIATE MEDICAL ATTENTION IF:  There is confusion or drowsiness (although children frequently become drowsy after injury).   You cannot awaken the injured person.   You have more than one episode of vomiting.   You notice dizziness or unsteadiness which is getting worse, or inability to walk.   You have convulsions or unconsciousness.   You experience severe, persistent headaches not relieved by Tylenol.  You cannot use arms or legs  normally.   There are changes in pupil sizes. (This is the black center in the colored part of the eye)   There is clear or bloody discharge from the nose or ears.   You have change in speech, vision, swallowing, or understanding.   Localized weakness, numbness, tingling, or change in bowel or bladder control.  You have any other emergent concerns.  Additional Information: You have had a head injury which does not appear to require admission at this time.  Your vital signs today were: BP 125/83 (BP Location: Right Arm)    Pulse 84    Temp 98.5 F (36.9 C) (Oral)    Resp 20    SpO2 99%  If your blood pressure (BP) was elevated above 135/85 this visit, please have this repeated by your doctor within one month. --------------

## 2019-09-12 NOTE — ED Triage Notes (Signed)
Head injury on 07/27/19 falling off of jail cell bed. Seen at [facility]. Still having pain and  Bumps on back of head. NAD steady gait.

## 2020-04-11 IMAGING — CR LUMBAR SPINE - COMPLETE 4+ VIEW
5 series · 5 of 5 positions shown · non-contrast
Comparison: None.

CLINICAL DATA: Pain following fall

EXAM:
LUMBAR SPINE - COMPLETE 4+ VIEW

[t lumbar spine ap]
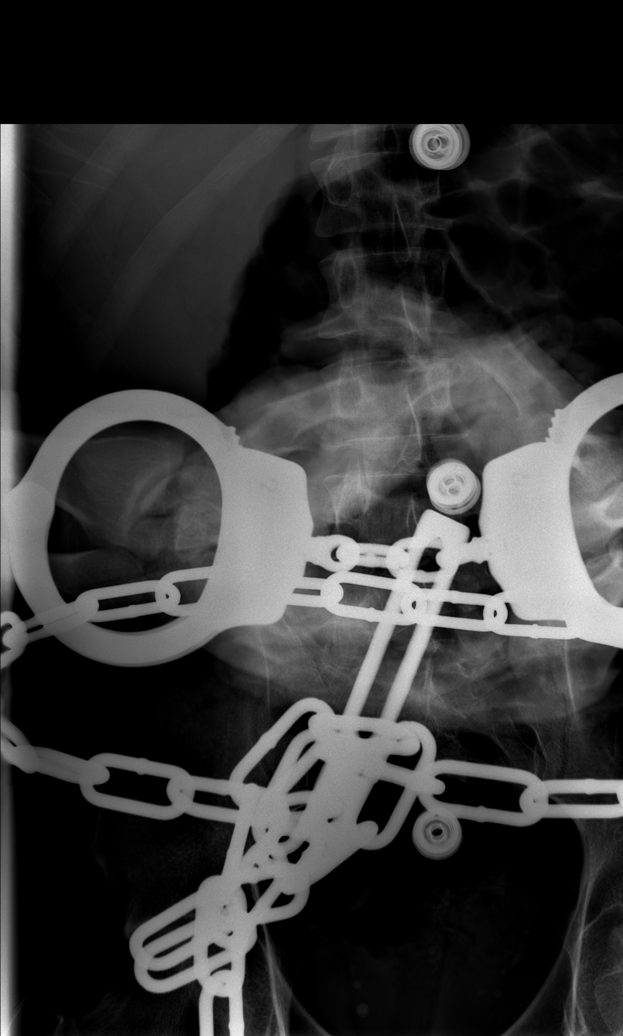

[t lumbar spine obl (1 of 2)]
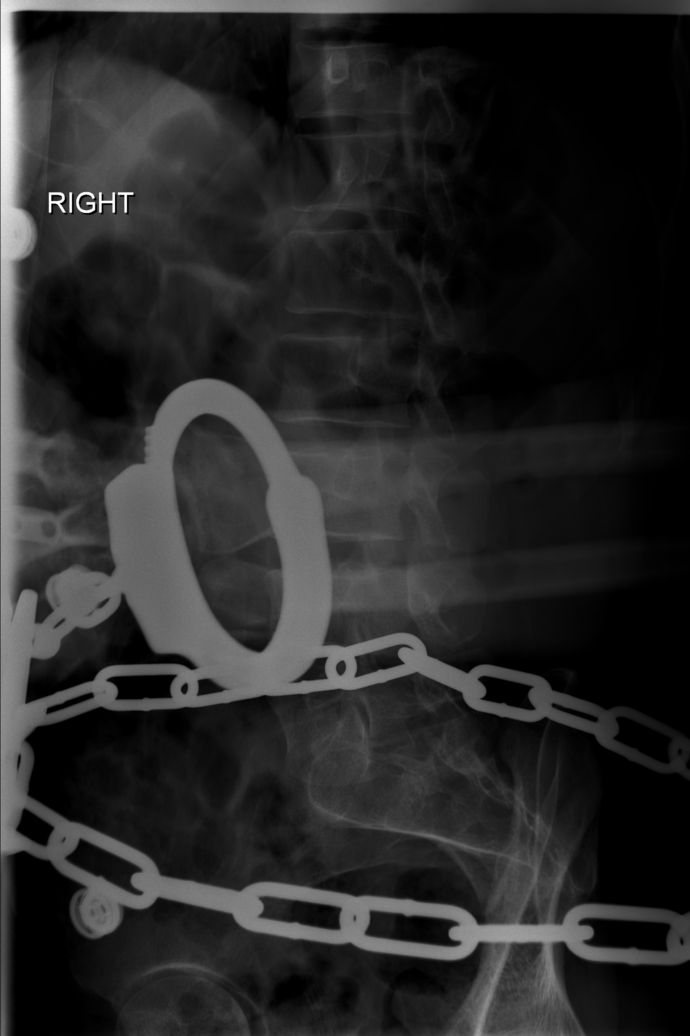

[t lumbar spine obl (2 of 2)]
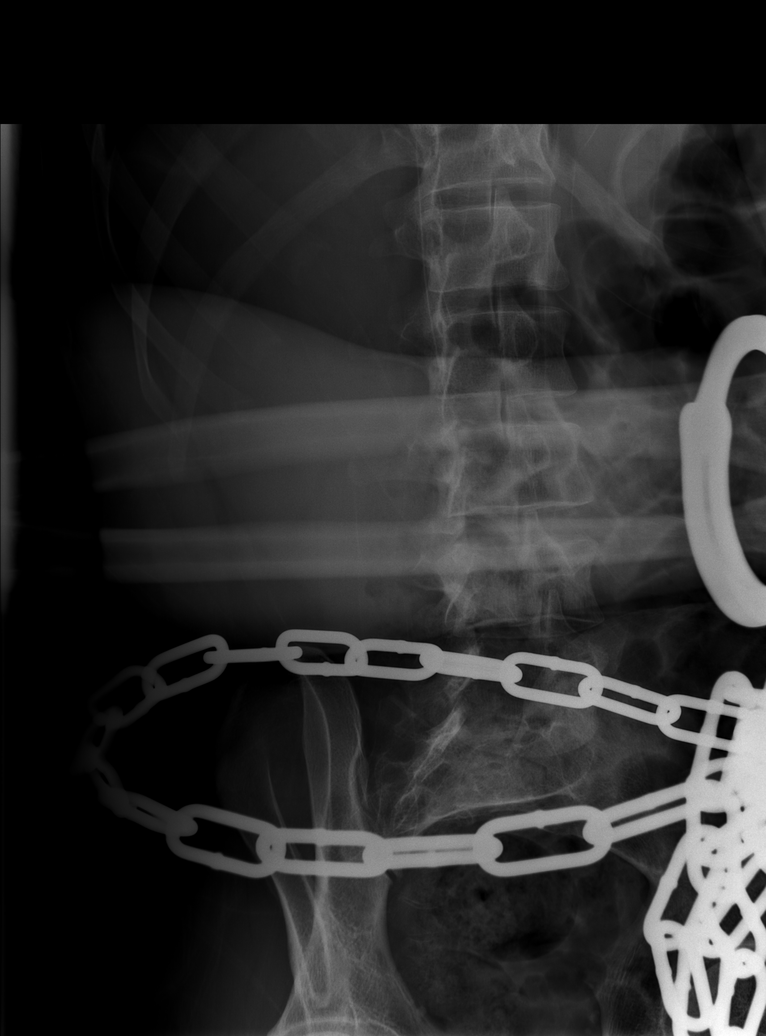

[t lumbar spine lat]
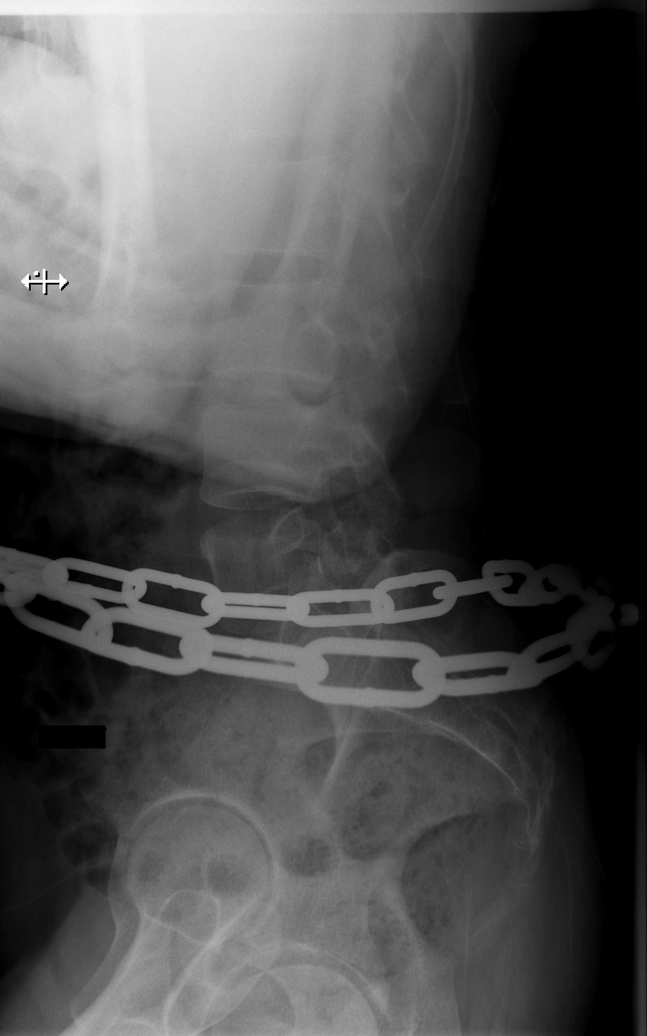

[t lumbar l-5 s-1 spot]
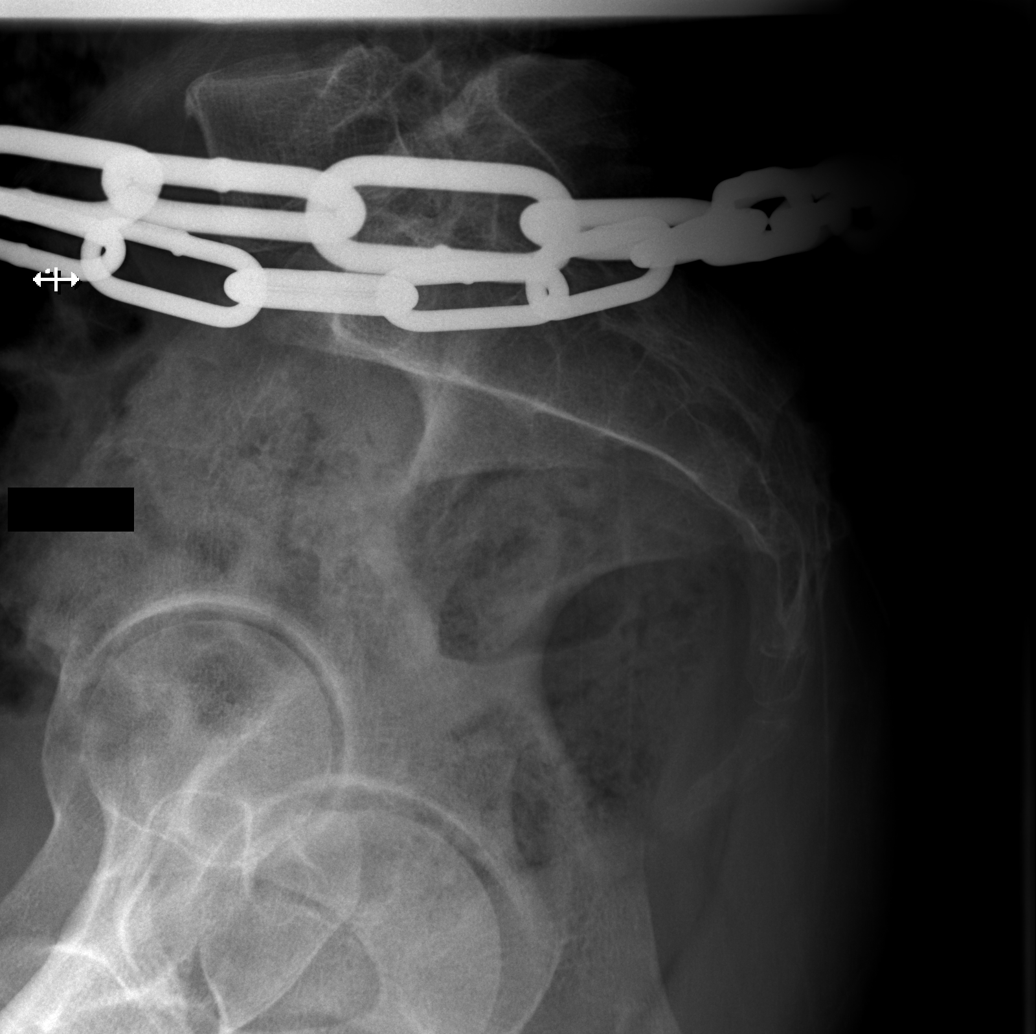

[5 of 5 positions shown; findings below may reference images not displayed]

FINDINGS: Frontal, lateral, spot lumbosacral lateral, and bilateral oblique
views were obtained. Study limited due to overlying shackles. There
are 5 non rib-bearing lumbar type vertebral bodies. No fracture or
spondylolisthesis is evident on this somewhat limited study. There
is no appreciable disc space narrowing or evident facet arthropathy.
IMPRESSION: Limited study due to overlying metallic artifact. No demonstrable
fracture or spondylolisthesis. No appreciable underlying
arthropathy.

## 2020-04-11 IMAGING — CT CT HEAD WITHOUT CONTRAST
5 of 7 series · 17 of 47 positions shown, 18 images · non-contrast
Comparison: September 05, 2011

CLINICAL DATA: Pain following fall

EXAM:
CT HEAD WITHOUT CONTRAST
CT CERVICAL SPINE WITHOUT CONTRAST
TECHNIQUE: Multidetector CT imaging of the head and cervical spine was
performed following the standard protocol without intravenous
contrast. Multiplanar CT image reconstructions of the cervical spine
were also generated.

[Series 2: head wo · axial · 0.47mm/px · z∈[-122,-67]mm · 2 of 33 slices shown, 3 images]
[im 11/33  brain]
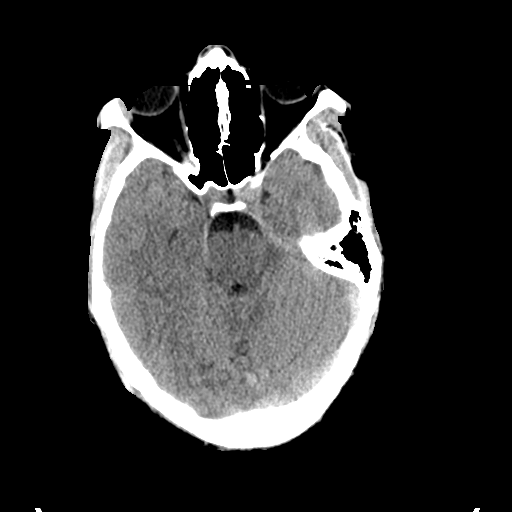
[im 11/33  bone]
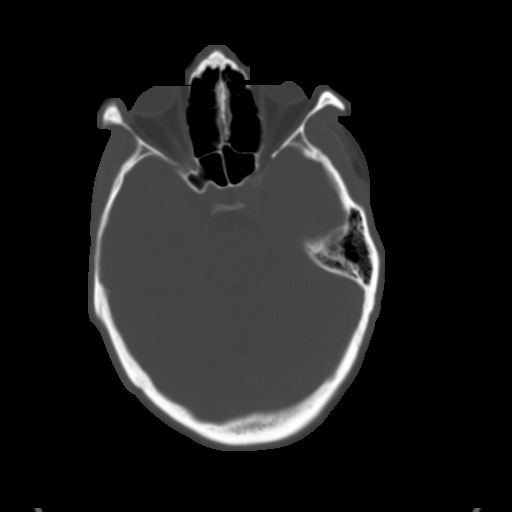
[im 22/33  brain]
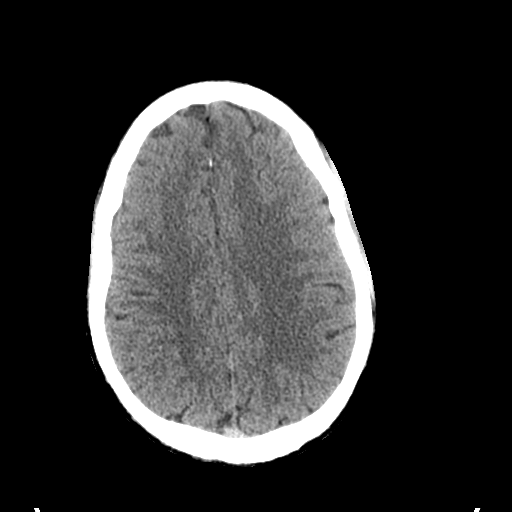

[Series 4: coronal soft tissue · coronal · 0.44mm/px · 3 of 68 slices shown]
[im 26/68  brain]
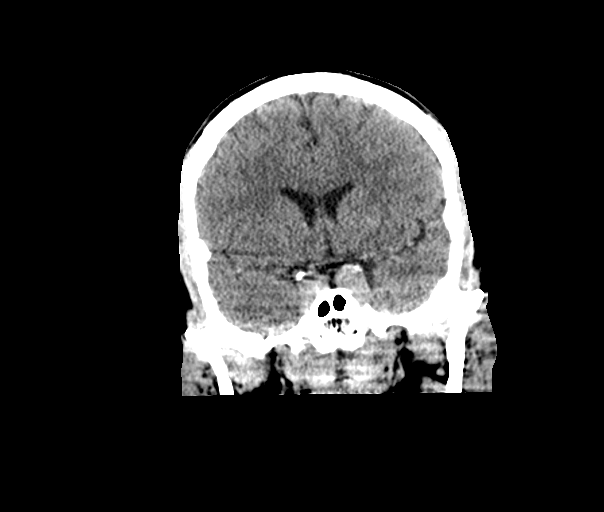
[im 34/68  brain]
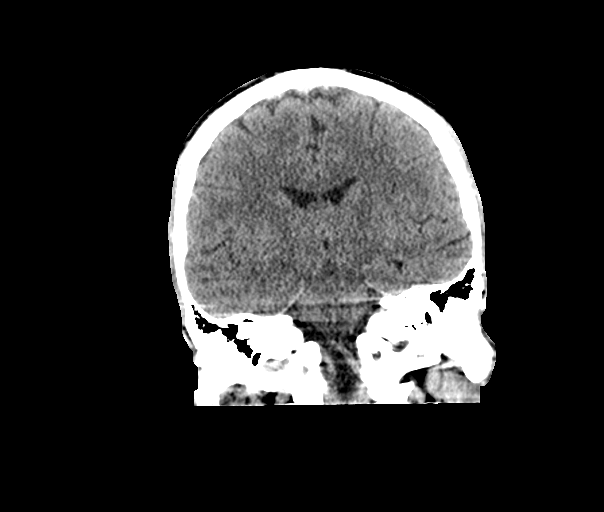
[im 42/68  brain]
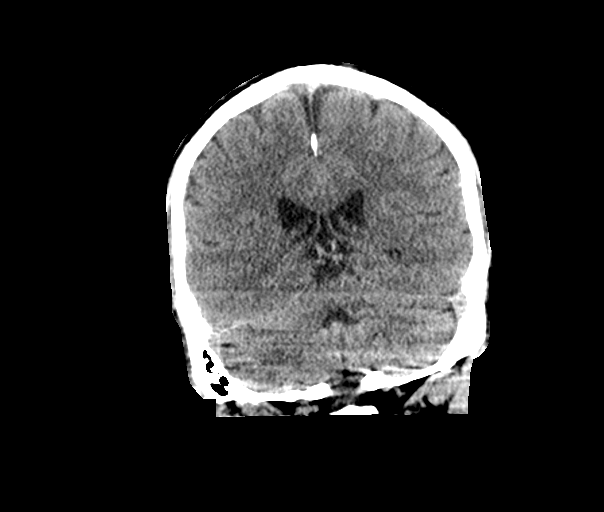

[Series 5: sagittal soft tissue · sagittal · 0.40mm/px · 1 of 54 slices shown]
[im 27/54  brain]
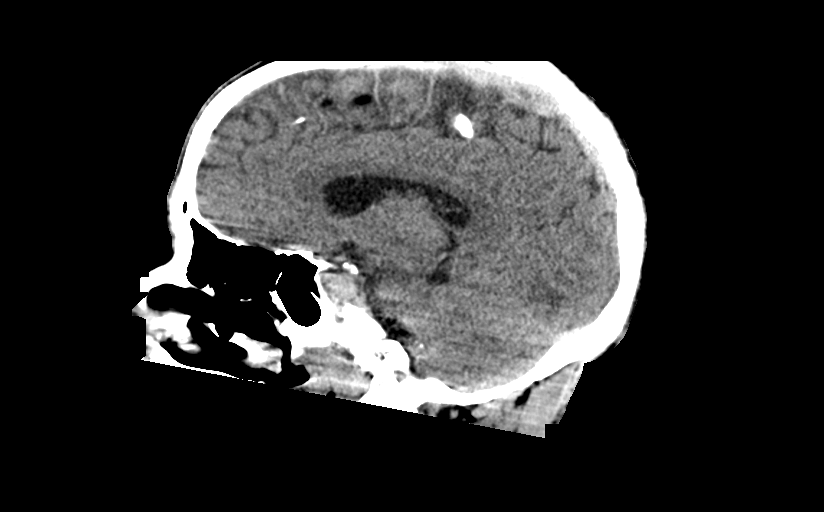

[Series 8: c spine soft · axial · 0.28mm/px · z∈[-339,-285]mm · 3 of 99 slices shown]
[im 9/99  brain]
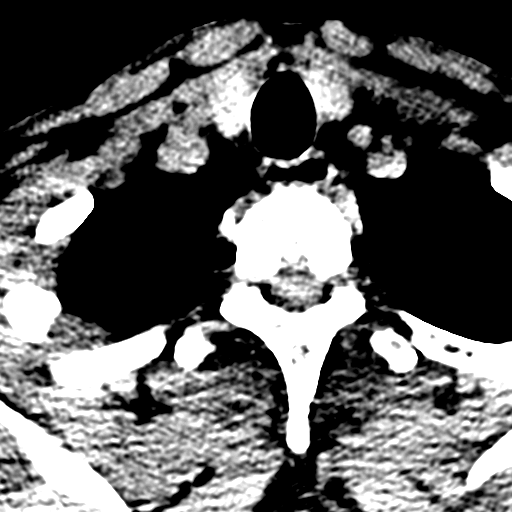
[im 18/99  brain]
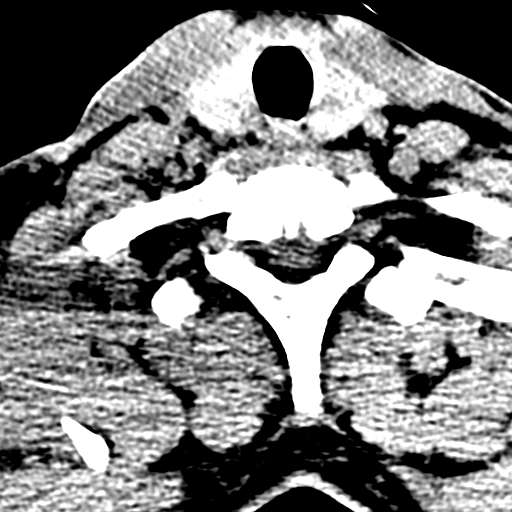
[im 36/99  brain]
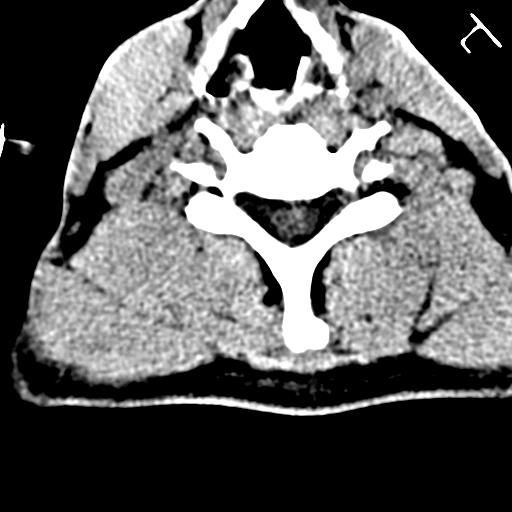

[Series 9: orthogonal bone · axial · 0.23mm/px · z∈[-369,-184]mm · 8 of 113 slices shown]
[im 9/113  bone]
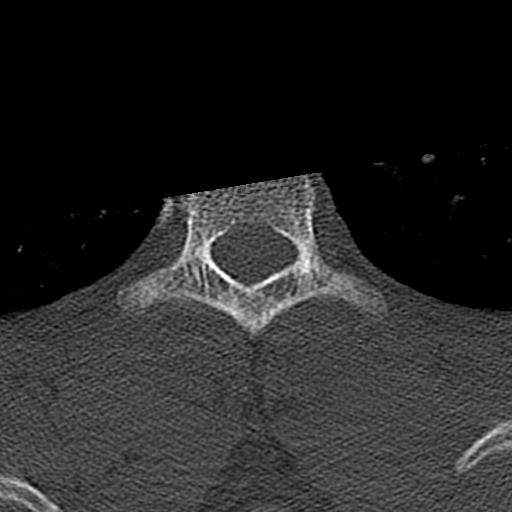
[im 26/113  bone]
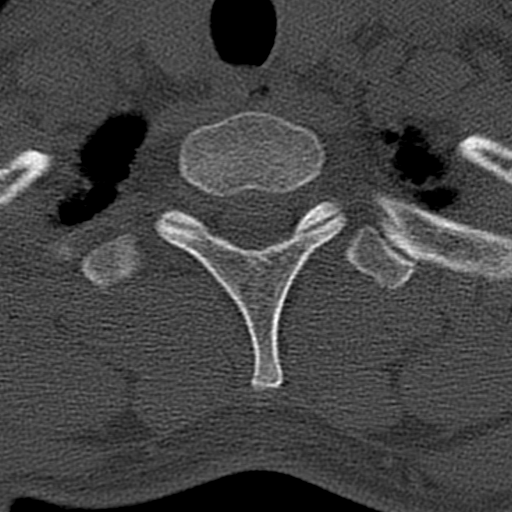
[im 35/113  bone]
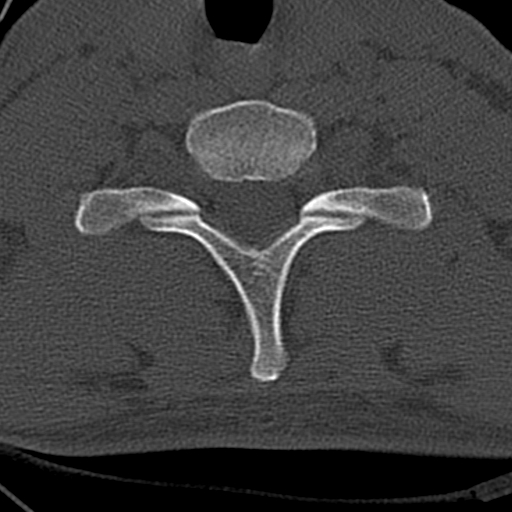
[im 52/113  bone]
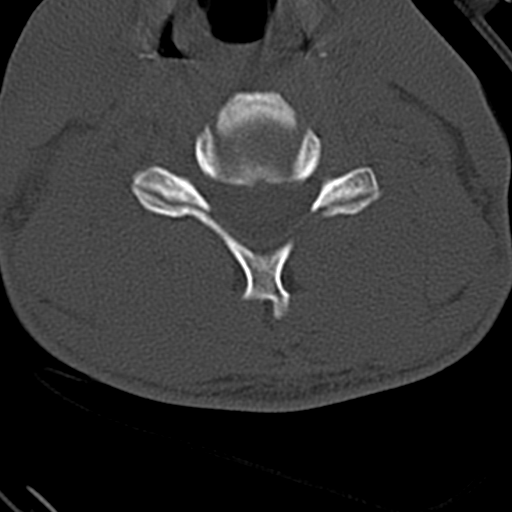
[im 61/113  bone]
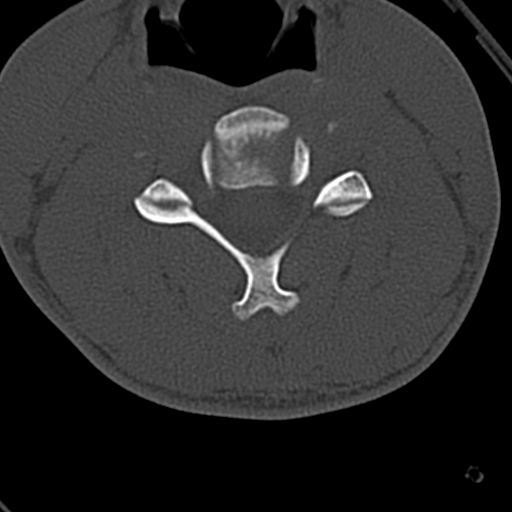
[im 78/113  bone]
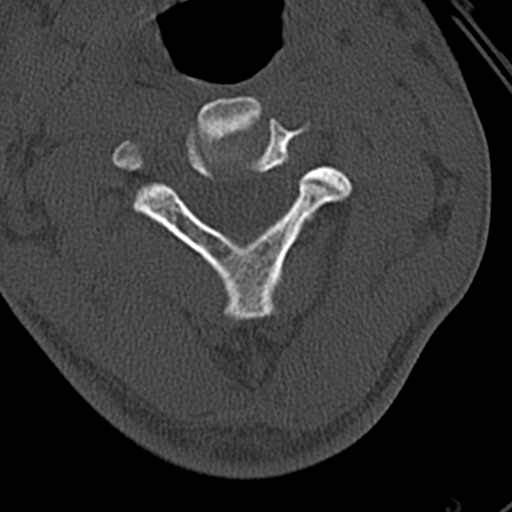
[im 87/113  bone]
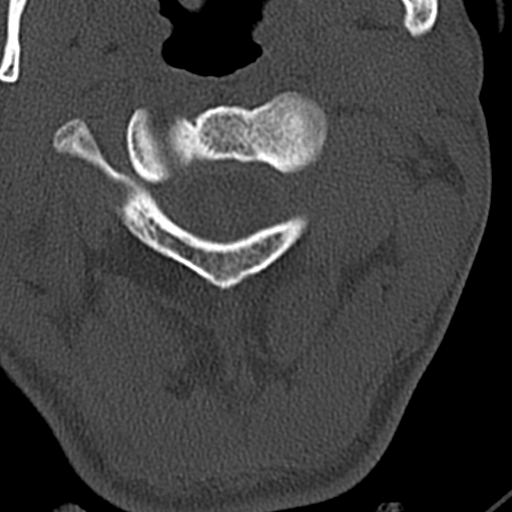
[im 104/113  bone]
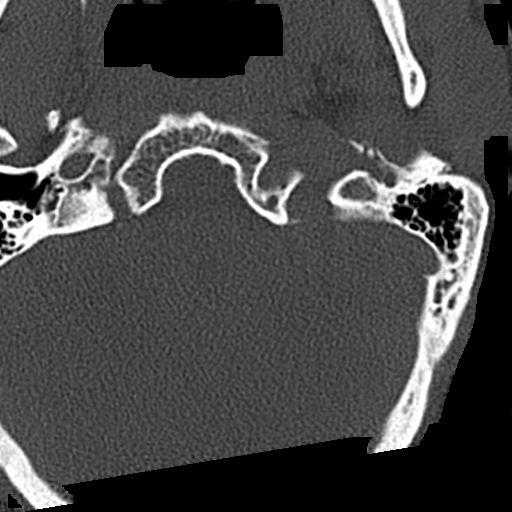

[17 of 47 positions shown; findings below may reference images not displayed]

FINDINGS: CT HEAD FINDINGS

Brain: Ventricles are normal in size and configuration. There is no
intracranial mass, hemorrhage, extra-axial fluid collection, or
midline shift. Brain parenchyma appears unremarkable. No acute
infarct evident.

Vascular: No hyperdense vessel.  No evident vascular calcification.

Skull: The bony calvarium appears intact.

Sinuses/Orbits: Visualized paranasal sinuses are clear. Orbits
appear symmetric bilaterally.

Other: Mastoid air cells are clear.

CT CERVICAL SPINE FINDINGS

Alignment: There is no demonstrable spondylolisthesis.

Skull base and vertebrae: Skull base and craniocervical junction
regions appear normal. No fracture evident. No blastic or lytic bone
lesions.

Soft tissues and spinal canal: Prevertebral soft tissues and
predental space regions are normal. There is no cord or canal
hematoma. No paraspinous lesion.

Disc levels: Disc spaces appear unremarkable. There is no evident
nerve root edema or effacement. No disc extrusion or stenosis. No
appreciable facet arthropathy.

Upper chest: Visualized upper lung regions are clear.

Other: None
IMPRESSION: CT head: Study within normal limits.

CT cervical spine: No fracture or spondylolisthesis. No appreciable
arthropathy. No nerve root edema or effacement. No disc extrusion or
stenosis.

## 2020-04-13 ENCOUNTER — Emergency Department (HOSPITAL_COMMUNITY): Payer: Self-pay

## 2020-04-13 ENCOUNTER — Emergency Department (HOSPITAL_COMMUNITY)
Admission: EM | Admit: 2020-04-13 | Discharge: 2020-04-13 | Disposition: A | Discharge status: Home / Self Care | Payer: Self-pay | Attending: Emergency Medicine | Admitting: Emergency Medicine

## 2020-04-13 ENCOUNTER — Encounter (HOSPITAL_COMMUNITY): Payer: Self-pay

## 2020-04-13 ENCOUNTER — Other Ambulatory Visit: Payer: Self-pay

## 2020-04-13 DIAGNOSIS — R10817 Generalized abdominal tenderness: Secondary | ICD-10-CM | POA: Insufficient documentation

## 2020-04-13 DIAGNOSIS — F17210 Nicotine dependence, cigarettes, uncomplicated: Secondary | ICD-10-CM | POA: Insufficient documentation

## 2020-04-13 DIAGNOSIS — N3090 Cystitis, unspecified without hematuria: Secondary | ICD-10-CM

## 2020-04-13 DIAGNOSIS — N3091 Cystitis, unspecified with hematuria: Secondary | ICD-10-CM | POA: Insufficient documentation

## 2020-04-13 DIAGNOSIS — R112 Nausea with vomiting, unspecified: Secondary | ICD-10-CM | POA: Insufficient documentation

## 2020-04-13 LAB — COMPREHENSIVE METABOLIC PANEL
ALT: 35 U/L (ref 0–44)
AST: 41 U/L (ref 15–41)
Albumin: 4.4 g/dL (ref 3.5–5.0)
Alkaline Phosphatase: 80 U/L (ref 38–126)
Anion gap: 8 (ref 5–15)
BUN: 17 mg/dL (ref 6–20)
CO2: 26 mmol/L (ref 22–32)
Calcium: 9.6 mg/dL (ref 8.9–10.3)
Chloride: 105 mmol/L (ref 98–111)
Creatinine, Ser: 1.13 mg/dL (ref 0.61–1.24)
GFR calc Af Amer: >60 mL/min (ref >60)
GFR calc non Af Amer: >60 mL/min (ref >60)
Glucose, Bld: 109 mg/dL — ABNORMAL HIGH (ref 70–99)
Potassium: 3.9 mmol/L (ref 3.5–5.1)
Sodium: 139 mmol/L (ref 135–145)
Total Bilirubin: 1.4 mg/dL — ABNORMAL HIGH (ref 0.3–1.2)
Total Protein: 7.9 g/dL (ref 6.5–8.1)

## 2020-04-13 LAB — URINALYSIS, ROUTINE W REFLEX MICROSCOPIC
Bilirubin Urine: NEGATIVE
Glucose, UA: NEGATIVE mg/dL
Hgb urine dipstick: NEGATIVE
Ketones, ur: NEGATIVE mg/dL
Leukocytes,Ua: NEGATIVE
Nitrite: NEGATIVE
Protein, ur: 30 mg/dL — AB
Specific Gravity, Urine: 1.031 — ABNORMAL HIGH (ref 1.005–1.030)
pH: 6 (ref 5.0–8.0)

## 2020-04-13 LAB — CBC WITH DIFFERENTIAL/PLATELET
Abs Immature Granulocytes: 0.01 10*3/uL (ref 0.00–0.07)
Basophils Absolute: 0 10*3/uL (ref 0.0–0.1)
Basophils Relative: 0 %
Eosinophils Absolute: 0.1 10*3/uL (ref 0.0–0.5)
Eosinophils Relative: 1 %
HCT: 50.1 % (ref 39.0–52.0)
Hemoglobin: 15.9 g/dL (ref 13.0–17.0)
Immature Granulocytes: 0 %
Lymphocytes Relative: 36 %
Lymphs Abs: 2.7 10*3/uL (ref 0.7–4.0)
MCH: 27.3 pg (ref 26.0–34.0)
MCHC: 31.7 g/dL (ref 30.0–36.0)
MCV: 86.1 fL (ref 80.0–100.0)
Monocytes Absolute: 0.4 10*3/uL (ref 0.1–1.0)
Monocytes Relative: 6 %
Neutro Abs: 4.3 10*3/uL (ref 1.7–7.7)
Neutrophils Relative %: 57 %
Platelets: 278 10*3/uL (ref 150–400)
RBC: 5.82 MIL/uL — ABNORMAL HIGH (ref 4.22–5.81)
RDW: 14.2 % (ref 11.5–15.5)
WBC: 7.6 10*3/uL (ref 4.0–10.5)
nRBC: 0 % (ref 0.0–0.2)

## 2020-04-13 LAB — LIPASE, BLOOD: Lipase: 29 U/L (ref 11–51)

## 2020-04-13 MED ORDER — CEPHALEXIN 250 MG PO CAPS
500.0000 mg | ORAL_CAPSULE | Freq: Once | ORAL | Status: AC
  Administered 2020-04-13: 500 mg via ORAL
  Filled 2020-04-13: qty 2

## 2020-04-13 MED ORDER — ACETAMINOPHEN 500 MG PO TABS
1000.0000 mg | ORAL_TABLET | Freq: Once | ORAL | Status: AC
  Administered 2020-04-13: 13:00:00 1000 mg via ORAL
  Filled 2020-04-13: qty 2

## 2020-04-13 MED ORDER — ONDANSETRON HCL 4 MG/2ML IJ SOLN
4.0000 mg | Freq: Once | INTRAMUSCULAR | Status: AC
  Administered 2020-04-13: 4 mg via INTRAVENOUS
  Filled 2020-04-13: qty 2

## 2020-04-13 MED ORDER — FENTANYL CITRATE (PF) 100 MCG/2ML IJ SOLN
50.0000 ug | Freq: Once | INTRAMUSCULAR | Status: AC
  Administered 2020-04-13: 13:00:00 50 ug via INTRAVENOUS
  Filled 2020-04-13: qty 2

## 2020-04-13 MED ORDER — ONDANSETRON 4 MG PO TBDP: 4.0000 mg | ORAL_TABLET | Freq: Three times a day (TID) | ORAL | 0 refills | Status: AC | PRN

## 2020-04-13 MED ORDER — CEFPODOXIME PROXETIL 200 MG PO TABS: 200.0000 mg | ORAL_TABLET | Freq: Two times a day (BID) | ORAL | 0 refills | Status: DC

## 2020-04-13 MED ORDER — SODIUM CHLORIDE 0.9 % IV BOLUS
1000.0000 mL | Freq: Once | INTRAVENOUS | Status: AC
  Administered 2020-04-13: 1000 mL via INTRAVENOUS

## 2020-04-13 MED ORDER — NAPROXEN 500 MG PO TABS: 500.0000 mg | ORAL_TABLET | Freq: Two times a day (BID) | ORAL | 0 refills | Status: DC

## 2020-04-13 NOTE — ED Provider Notes (Signed)
[facility] EMERGENCY DEPARTMENT Provider Note   CSN: [id number] Arrival date & time: 04/13/20  6144     History Chief Complaint  Patient presents with  . Hematuria    Michael Glover is a 34 y.o. male with a history of asthma and depression who presents to the ED with complaints of hematuria & abdominal pain since yesterday. Patient states he noted bright red blood in his urine with associated nausea, 2 episodes of emesis, and bilateral abdominal/back pain which is constant, 8/10 in severity, no alleviating/aggravating factors. Denies fever, chills, dyspnea, chest pain, penile discharge, testicular pain/swelling, pain with bowel movements, numbness, or weakness.   HPI     Past Medical History:  Diagnosis Date  . Asthma   . Depression     Patient Active Problem List   Diagnosis Date Noted  . Nausea & vomiting 12/23/2011  . Depression 12/23/2011  . Abdominal pain 12/23/2011  . Black stool 12/23/2011    History reviewed. No pertinent surgical history.     History reviewed. No pertinent family history.  Social History   Tobacco Use  . Smoking status: Current Every Day Smoker    Types: Cigarettes  . Smokeless tobacco: Never Used  Substance Use Topics  . Alcohol use: No  . Drug use: No    Home Medications Prior to Admission medications   Medication Sig Start Date End Date Taking? Authorizing Provider  cephALEXin (KEFLEX) 500 MG capsule Take 1 capsule (500 mg total) by mouth 4 (four) times daily. 09/12/19   Renne Crigler, PA-C  cyclobenzaprine (FLEXERIL) 10 MG tablet Take 1 tablet (10 mg total) by mouth 2 (two) times daily as needed for muscle spasms. 09/12/19   Renne Crigler, PA-C  ibuprofen (ADVIL) 600 MG tablet Take 1 tablet (600 mg total) by mouth every 6 (six) hours as needed. 09/12/19   Renne Crigler, PA-C    Allergies    Patient has no known allergies.  Review of Systems   Review of Systems  Constitutional: Negative for chills and  fever.  Gastrointestinal: Positive for abdominal pain, nausea and vomiting. Negative for anal bleeding, blood in stool, constipation and diarrhea.  Genitourinary: Positive for flank pain and hematuria. Negative for dysuria, penile pain, penile swelling, scrotal swelling and testicular pain.  Musculoskeletal: Positive for back pain.  Neurological: Negative for weakness and numbness.  All other systems reviewed and are negative.   Physical Exam Updated Vital Signs BP 133/88   Pulse 97   Temp 98.1 F (36.7 C) (Oral)   Resp 16   Ht 5\' 10"  (1.778 m)   Wt 106.6 kg   SpO2 99%   BMI 33.72 kg/m   Physical Exam Vitals and nursing note reviewed.  Constitutional:      General: He is not in acute distress.    Appearance: He is well-developed. He is not toxic-appearing.  HENT:     Head: Normocephalic and atraumatic.  Eyes:     General:        Right eye: No discharge.        Left eye: No discharge.     Conjunctiva/sclera: Conjunctivae normal.  Cardiovascular:     Rate and Rhythm: Normal rate and regular rhythm.  Pulmonary:     Effort: Pulmonary effort is normal. No respiratory distress.     Breath sounds: Normal breath sounds. No wheezing, rhonchi or rales.  Abdominal:     General: There is no distension.     Palpations: Abdomen is soft.  Tenderness: There is abdominal tenderness (mild generalized). There is no right CVA tenderness, left CVA tenderness, guarding or rebound.  Musculoskeletal:     Cervical back: Neck supple.  Skin:    General: Skin is warm and dry.     Findings: No rash.  Neurological:     Mental Status: He is alert.     Comments: Clear speech.   Psychiatric:        Behavior: Behavior normal.    ED Results / Procedures / Treatments   Labs (all labs ordered are listed, but only abnormal results are displayed) Labs Reviewed  URINALYSIS, ROUTINE W REFLEX MICROSCOPIC - Abnormal; Notable for the following components:      Result Value   APPearance HAZY (*)      Specific Gravity, Urine 1.031 (*)    Protein, ur 30 (*)    Bacteria, UA MANY (*)    All other components within normal limits  COMPREHENSIVE METABOLIC PANEL - Abnormal; Notable for the following components:   Glucose, Bld 109 (*)    Total Bilirubin 1.4 (*)    All other components within normal limits  CBC WITH DIFFERENTIAL/PLATELET - Abnormal; Notable for the following components:   RBC 5.82 (*)    All other components within normal limits  URINE CULTURE  LIPASE, BLOOD    EKG None  Radiology CT Renal Stone Study  Result Date: 04/13/2020 CLINICAL DATA:  Hematuria.  Generalized abdominal pain EXAM: CT ABDOMEN AND PELVIS WITHOUT CONTRAST TECHNIQUE: Multidetector CT imaging of the abdomen and pelvis was performed following the standard protocol without IV contrast. COMPARISON:  None. FINDINGS: Lower chest: Lung bases are clear. Hepatobiliary: No focal hepatic lesion. No biliary duct dilatation. Gallbladder is normal. Common bile duct is normal. Pancreas: Pancreas is normal. No ductal dilatation. No pancreatic inflammation. Spleen: Normal spleen Adrenals/urinary tract: Adrenal glands are normal. No nephrolithiasis or ureterolithiasis. Obstructive uropathy. No bladder calculi. Stomach/Bowel: Stomach, small-bowel and cecum are normal. The appendix is not identified but there is no pericecal inflammation to suggest appendicitis. Ascending, transverse, descending colon normal. Rectum normal. Vascular/Lymphatic: Abdominal aorta is normal caliber. No periportal or retroperitoneal adenopathy. No pelvic adenopathy. Reproductive: Prostate normal. Other: No free fluid. Musculoskeletal: No aggressive osseous lesion. IMPRESSION: 1. No nephrolithiasis, ureterolithiasis or obstructive uropathy. 2. No bladder calculi. 3. No acute abdominopelvic findings. Electronically Signed   By: Suzy Bouchard M.D.   On: 04/13/2020 11:30    Procedures Procedures (including critical care time)  Medications Ordered in  ED Medications  fentaNYL (SUBLIMAZE) injection 50 mcg (has no administration in time range)  ondansetron (ZOFRAN) injection 4 mg (has no administration in time range)  sodium chloride 0.9 % bolus 1,000 mL (has no administration in time range)    ED Course  I have reviewed the triage vital signs and the nursing notes.  Pertinent labs & imaging results that were available during my care of the patient were reviewed by me and considered in my medical decision making (see chart for details).    MDM Rules/Calculators/A&P                     This patient presents to the ED for concern of hematuria & abdominal/back pain since yesterday. DDx: UTI/pyelo, nephrolithiasis, appendicitis, obstruction, perforation, GERD, MSK. Orchitis/epididymitis seems less likely without penile discharge or testicular complaints, patient denies concern for STI.   Additional history obtained:  Additional history obtained from chart review.   Lab Tests:  I Ordered, reviewed, and interpreted labs,  which included:  CBC: No anemia or leukocytosis.  CMP: No significant electrolyte derangement, renal function & LFTs WNL.  Lipase: WNL UA: Concerning for infection- culture sent.  Imaging Studies ordered:  I ordered imaging studies which included CT renal stone study, I independently visualized and interpreted imaging which showed 1. No nephrolithiasis, ureterolithiasis or obstructive uropathy. 2. No bladder calculi. 3. No acute abdominopelvic findings.   Medicines ordered:  I ordered medication fentanyl & tylenol for pain & zofran for nausea.   Reevaluation: After the interventions stated above, I reevaluated the patient and found that he has had symptomatic improvement & is tolerating PO.   Will discharge home with abx for UTI and symptomatic management. I discussed results, treatment plan, need for follow-up, and return precautions with the patient. Provided opportunity for questions, patient confirmed understanding  and is in agreement with plan.   This is a shared visit with supervising physician Dr. Denton Lank who has independently evaluated patient & provided guidance in evaluation/management/disposition, in agreement with care   Portions of this note were generated with Dragon dictation software. Dictation errors may occur despite best attempts at proofreading.  Final Clinical Impression(s) / ED Diagnoses Final diagnoses:  Cystitis    Rx / DC Orders ED Discharge Orders         Ordered    cefpodoxime (VANTIN) 200 MG tablet  2 times daily     04/13/20 1245    ondansetron (ZOFRAN ODT) 4 MG disintegrating tablet  Every 8 hours PRN     04/13/20 1245    naproxen (NAPROSYN) 500 MG tablet  2 times daily     04/13/20 [address] [place], PA-C 04/13/20 1246    Cathren Laine, MD 04/13/20 1515

## 2020-04-13 NOTE — ED Triage Notes (Signed)
Pt reports blood in his urine since this morning, some abd pain, emesis x2 yesterday. Pt eating [employer] during triage.

## 2020-04-13 NOTE — ED Notes (Signed)
Stuck patient in his right [facility] for labs

## 2020-04-13 NOTE — Discharge Instructions (Signed)
Thank you for allowing [place] to be part of your care team today.  You were seen in the emergency department today for blood in your urine, abdominal pain, back pain.  Your CT scan did not show any significant abnormalities.  Your labs were overall reassuring.  Your urine did show signs of infection.  We are sending you home with the following medicines:   - Naproxen is a nonsteroidal anti-inflammatory medication that will help with pain and swelling. Be sure to take this medication as prescribed with food, 1 pill every 12 hours,  It should be taken with food, as it can cause stomach upset, and more seriously, stomach bleeding. Do not take other nonsteroidal anti-inflammatory medications with this such as Advil, Motrin, Aleve, Mobic, Goodie Powder, or Motrin.    -Zofran: This is a medication for nausea and vomiting, take every 8 hours as needed  -Cefpodoxime: This is an antibiotic, please take this twice per day for the next 10 days.  You make take Tylenol per over the counter dosing with these medications.   We have prescribed you new medication(s) today. Discuss the medications prescribed today with your pharmacist as they can have adverse effects and interactions with your other medicines including over the counter and prescribed medications. Seek medical evaluation if you start to experience new or abnormal symptoms after taking one of these medicines, seek care immediately if you start to experience difficulty breathing, feeling of your throat closing, facial swelling, or rash as these could be indications of a more serious allergic reaction   Please follow with your primary care provider within 1 week for reevaluation.  Return to the ER for new or worsening symptoms including but not limited to increased pain, inability to keep fluids down, fever, or any other concerns.

## 2020-04-14 ENCOUNTER — Encounter (HOSPITAL_COMMUNITY): Payer: Self-pay

## 2020-04-14 ENCOUNTER — Emergency Department (HOSPITAL_COMMUNITY)
Admission: EM | Admit: 2020-04-14 | Discharge: 2020-04-14 | Disposition: A | Discharge status: Home / Self Care | Payer: Self-pay | Attending: Emergency Medicine | Admitting: Emergency Medicine

## 2020-04-14 ENCOUNTER — Other Ambulatory Visit: Payer: Self-pay

## 2020-04-14 DIAGNOSIS — R319 Hematuria, unspecified: Secondary | ICD-10-CM | POA: Insufficient documentation

## 2020-04-14 DIAGNOSIS — F17210 Nicotine dependence, cigarettes, uncomplicated: Secondary | ICD-10-CM | POA: Insufficient documentation

## 2020-04-14 DIAGNOSIS — R109 Unspecified abdominal pain: Secondary | ICD-10-CM | POA: Insufficient documentation

## 2020-04-14 MED ORDER — ACETAMINOPHEN 500 MG PO TABS
1000.0000 mg | ORAL_TABLET | Freq: Once | ORAL | Status: AC
  Administered 2020-04-14: 13:00:00 1000 mg via ORAL
  Filled 2020-04-14: qty 2

## 2020-04-14 MED ORDER — CEPHALEXIN 500 MG PO CAPS: 500.0000 mg | ORAL_CAPSULE | Freq: Two times a day (BID) | ORAL | 0 refills | Status: AC

## 2020-04-14 NOTE — ED Triage Notes (Signed)
Pt returns for hematuria and abd pain, pt seen and discharged with rx but states he cannot afford them. Pt eating [employer] during triage

## 2020-04-14 NOTE — ED Provider Notes (Signed)
[name] EMERGENCY DEPARTMENT Provider Note   CSN: [id number] Arrival date & time: 04/14/20  1047     History Chief Complaint  Patient presents with  . Hematuria    Michael Glover is a 34 y.o. male.  34 y.o male with a PMH of Asthma, Depression presents to the ED with a chief complaint of hematuria.  Evaluated in the ED yesterday, sent home with a prescription for Vantin, Zofran, naproxen.  Reports he has not filled this medication due to lack of financial means.  Here at the ED with a [employer] muffin, which she finished in the waiting room.  No exacerbating factors.  Reports hematuria has continued although he has not began treatment.  No fevers, vomiting, other complaints.  The history is provided by the patient.  Hematuria Associated symptoms include abdominal pain.       Past Medical History:  Diagnosis Date  . Asthma   . Depression     Patient Active Problem List   Diagnosis Date Noted  . Nausea & vomiting 12/23/2011  . Depression 12/23/2011  . Abdominal pain 12/23/2011  . Black stool 12/23/2011    History reviewed. No pertinent surgical history.     No family history on file.  Social History   Tobacco Use  . Smoking status: Current Every Day Smoker    Types: Cigarettes  . Smokeless tobacco: Never Used  Substance Use Topics  . Alcohol use: No  . Drug use: No    Home Medications Prior to Admission medications   Medication Sig Start Date End Date Taking? Authorizing Provider  ARIPiprazole (ABILIFY) 20 MG tablet Take 20 mg by mouth every evening. 03/24/20   [provider]  cephALEXin (KEFLEX) 500 MG capsule Take 1 capsule (500 mg total) by mouth 2 (two) times daily for 7 days. 04/14/20 04/21/20  Claude Manges, PA-C  cyclobenzaprine (FLEXERIL) 10 MG tablet Take 1 tablet (10 mg total) by mouth 2 (two) times daily as needed for muscle spasms. 09/12/19   Renne Crigler, PA-C  FLUoxetine (PROZAC) 20 MG capsule Take 20 mg by  mouth every morning. 03/23/20   [provider]  ibuprofen (ADVIL) 600 MG tablet Take 1 tablet (600 mg total) by mouth every 6 (six) hours as needed. 09/12/19   Renne Crigler, PA-C  naproxen (NAPROSYN) 500 MG tablet Take 1 tablet (500 mg total) by mouth 2 (two) times daily. 04/13/20   Petrucelli, Samantha R, PA-C  ondansetron (ZOFRAN ODT) 4 MG disintegrating tablet Take 1 tablet (4 mg total) by mouth every 8 (eight) hours as needed for nausea or vomiting. 04/13/20   Petrucelli, Pleas Koch, PA-C    Allergies    Patient has no known allergies.  Review of Systems   Review of Systems  Constitutional: Negative for fever.  Gastrointestinal: Positive for abdominal pain. Negative for nausea and vomiting.  Genitourinary: Positive for hematuria.    Physical Exam Updated Vital Signs BP 130/86 (BP Location: Right Arm)   Pulse 84   Temp 99.1 F (37.3 C) (Oral)   Resp 17   SpO2 100%   Physical Exam Vitals and nursing note reviewed.  Constitutional:      Appearance: Normal appearance.     Comments: Non ill appearing, non toxic.   HENT:     Head: Normocephalic and atraumatic.     Mouth/Throat:     Mouth: Mucous membranes are moist.  Cardiovascular:     Rate and Rhythm: Normal rate.  Pulmonary:  Effort: Pulmonary effort is normal.     Breath sounds: No wheezing or rales.  Abdominal:     General: Abdomen is flat.     Tenderness: There is no abdominal tenderness. There is no right CVA tenderness, left CVA tenderness, guarding or rebound.     Comments: Bowel sounds are normal, abdomen is soft, non tender to palpation.   Musculoskeletal:     Cervical back: Normal range of motion and neck supple.  Skin:    General: Skin is warm and dry.  Neurological:     Mental Status: He is alert and oriented to person, place, and time.     ED Results / Procedures / Treatments   Labs (all labs ordered are listed, but only abnormal results are displayed) Labs Reviewed - No data to  display  EKG None  Radiology CT Renal Stone Study  Result Date: 04/13/2020 CLINICAL DATA:  Hematuria.  Generalized abdominal pain EXAM: CT ABDOMEN AND PELVIS WITHOUT CONTRAST TECHNIQUE: Multidetector CT imaging of the abdomen and pelvis was performed following the standard protocol without IV contrast. COMPARISON:  None. FINDINGS: Lower chest: Lung bases are clear. Hepatobiliary: No focal hepatic lesion. No biliary duct dilatation. Gallbladder is normal. Common bile duct is normal. Pancreas: Pancreas is normal. No ductal dilatation. No pancreatic inflammation. Spleen: Normal spleen Adrenals/urinary tract: Adrenal glands are normal. No nephrolithiasis or ureterolithiasis. Obstructive uropathy. No bladder calculi. Stomach/Bowel: Stomach, small-bowel and cecum are normal. The appendix is not identified but there is no pericecal inflammation to suggest appendicitis. Ascending, transverse, descending colon normal. Rectum normal. Vascular/Lymphatic: Abdominal aorta is normal caliber. No periportal or retroperitoneal adenopathy. No pelvic adenopathy. Reproductive: Prostate normal. Other: No free fluid. Musculoskeletal: No aggressive osseous lesion. IMPRESSION: 1. No nephrolithiasis, ureterolithiasis or obstructive uropathy. 2. No bladder calculi. 3. No acute abdominopelvic findings. Electronically Signed   By: Suzy Bouchard M.D.   On: 04/13/2020 11:30    Procedures Procedures (including critical care time)  Medications Ordered in ED Medications  acetaminophen (TYLENOL) tablet 1,000 mg (1,000 mg Oral Given 04/14/20 1236)    ED Course  I have reviewed the triage vital signs and the nursing notes.  Pertinent labs & imaging results that were available during my care of the patient were reviewed by me and considered in my medical decision making (see chart for details).    MDM Rules/Calculators/A&P   Patient with a past medical history of asthma, depression presents to the ED with complaints of  hematuria.  According to his chart which have extensively reviewed, patient was evaluated in the ED yesterday, sent home with a prescription for naproxen, Vantin, Zofran to help with nausea.  Patient arrived in the ED eating [employer] according to nursing note.  He reports he has not filled his prescription as he does not have money to afford this, he was given a prescription for [facility], pharmacy consultation was made to find an alternative therapy.  Review of imaging yesterday, with a CT that was within normal limits.  His CBC yesterday was within normal limits, creatinine level was unremarkable.  UA was remarkable for many bacteria.  Consultation with pharmacist was placed for recommendations.  Review of third-generation cephalosporins, Vantin  would be the cheaper choice for patient to obtain at this time.  Discussion with pharmacist about placing him on a short course of Keflex until urine culture return for accurate treatment.  Patient is agreeable of this therapy, he is aware he will be called with replacement on antibiotic therapy if needed.  Reports nausea along with vomiting, has not had any vomiting while in the ED despite not being given any medication, finishes [employer] muffin prior to leaving ED.  Return precautions discussed at length.    Portions of this note were generated with Scientist, clinical (histocompatibility and immunogenetics). Dictation errors may occur despite best attempts at proofreading.  Final Clinical Impression(s) / ED Diagnoses Final diagnoses:  Hematuria, unspecified type    Rx / DC Orders ED Discharge Orders         Ordered    cephALEXin (KEFLEX) 500 MG capsule  2 times daily     04/14/20 1256           Claude Manges, PA-C 04/14/20 1257    Tegeler, Canary Brim, MD 04/14/20 [phone]

## 2020-04-14 NOTE — Discharge Instructions (Addendum)
I have provided a new prescription for antibiotics, please take 1 tablet twice a day for the next 7 days.  This antibiotic should cost a maximum of $10 at [facility].  Your urine culture has yet to return, if this grows a different organism, antibiotics will likely need to be changed.

## 2020-04-15 LAB — URINE CULTURE: Culture: <10000 — AB

## 2020-04-27 ENCOUNTER — Other Ambulatory Visit: Payer: Self-pay

## 2020-04-27 ENCOUNTER — Encounter (HOSPITAL_BASED_OUTPATIENT_CLINIC_OR_DEPARTMENT_OTHER): Payer: Self-pay | Admitting: *Deleted

## 2020-04-27 ENCOUNTER — Emergency Department (HOSPITAL_BASED_OUTPATIENT_CLINIC_OR_DEPARTMENT_OTHER)
Admission: EM | Admit: 2020-04-27 | Discharge: 2020-04-27 | Disposition: A | Discharge status: Home / Self Care | Payer: Self-pay | Attending: Emergency Medicine | Admitting: Emergency Medicine

## 2020-04-27 DIAGNOSIS — M545 Low back pain, unspecified: Secondary | ICD-10-CM

## 2020-04-27 DIAGNOSIS — R319 Hematuria, unspecified: Secondary | ICD-10-CM | POA: Insufficient documentation

## 2020-04-27 DIAGNOSIS — F17210 Nicotine dependence, cigarettes, uncomplicated: Secondary | ICD-10-CM | POA: Insufficient documentation

## 2020-04-27 DIAGNOSIS — J45909 Unspecified asthma, uncomplicated: Secondary | ICD-10-CM | POA: Insufficient documentation

## 2020-04-27 DIAGNOSIS — Z79899 Other long term (current) drug therapy: Secondary | ICD-10-CM | POA: Insufficient documentation

## 2020-04-27 LAB — BASIC METABOLIC PANEL
Anion gap: 9 (ref 5–15)
BUN: 18 mg/dL (ref 6–20)
CO2: 24 mmol/L (ref 22–32)
Calcium: 9 mg/dL (ref 8.9–10.3)
Chloride: 104 mmol/L (ref 98–111)
Creatinine, Ser: 1.14 mg/dL (ref 0.61–1.24)
GFR calc Af Amer: >60 mL/min (ref >60)
GFR calc non Af Amer: >60 mL/min (ref >60)
Glucose, Bld: 101 mg/dL — ABNORMAL HIGH (ref 70–99)
Potassium: 3.7 mmol/L (ref 3.5–5.1)
Sodium: 137 mmol/L (ref 135–145)

## 2020-04-27 LAB — URINALYSIS, ROUTINE W REFLEX MICROSCOPIC
Bilirubin Urine: NEGATIVE
Glucose, UA: NEGATIVE mg/dL
Hgb urine dipstick: NEGATIVE
Ketones, ur: NEGATIVE mg/dL
Leukocytes,Ua: NEGATIVE
Nitrite: NEGATIVE
Protein, ur: NEGATIVE mg/dL
Specific Gravity, Urine: 1.025 (ref 1.005–1.030)
pH: 6 (ref 5.0–8.0)

## 2020-04-27 LAB — CBC WITH DIFFERENTIAL/PLATELET
Abs Immature Granulocytes: 0.01 10*3/uL (ref 0.00–0.07)
Basophils Absolute: 0 10*3/uL (ref 0.0–0.1)
Basophils Relative: 0 %
Eosinophils Absolute: 0.2 10*3/uL (ref 0.0–0.5)
Eosinophils Relative: 3 %
HCT: 46.7 % (ref 39.0–52.0)
Hemoglobin: 15.1 g/dL (ref 13.0–17.0)
Immature Granulocytes: 0 %
Lymphocytes Relative: 43 %
Lymphs Abs: 2.9 10*3/uL (ref 0.7–4.0)
MCH: 27.5 pg (ref 26.0–34.0)
MCHC: 32.3 g/dL (ref 30.0–36.0)
MCV: 85.1 fL (ref 80.0–100.0)
Monocytes Absolute: 0.4 10*3/uL (ref 0.1–1.0)
Monocytes Relative: 6 %
Neutro Abs: 3.2 10*3/uL (ref 1.7–7.7)
Neutrophils Relative %: 48 %
Platelets: 226 10*3/uL (ref 150–400)
RBC: 5.49 MIL/uL (ref 4.22–5.81)
RDW: 13.9 % (ref 11.5–15.5)
WBC: 6.7 10*3/uL (ref 4.0–10.5)
nRBC: 0 % (ref 0.0–0.2)

## 2020-04-27 MED ORDER — NAPROXEN 500 MG PO TABS: 500.0000 mg | ORAL_TABLET | Freq: Two times a day (BID) | ORAL | 0 refills | Status: AC

## 2020-04-27 MED ORDER — KETOROLAC TROMETHAMINE 30 MG/ML IJ SOLN
30.0000 mg | Freq: Once | INTRAMUSCULAR | Status: AC
  Administered 2020-04-27: 30 mg via INTRAVENOUS
  Filled 2020-04-27: qty 1

## 2020-04-27 MED ORDER — CYCLOBENZAPRINE HCL 10 MG PO TABS: 10.0000 mg | ORAL_TABLET | Freq: Two times a day (BID) | ORAL | 0 refills | Status: AC | PRN

## 2020-04-27 NOTE — ED Triage Notes (Signed)
Hematuria.  Back pain.

## 2020-04-27 NOTE — Discharge Instructions (Addendum)
You were seen in the emergency department for evaluation of low back pain and intermittent blood in your urine.  Your urine did not show any signs of blood or infection today.  Your kidney function and other blood counts were normal.  Please schedule a follow-up appointment with urology.  We are prescribing you some anti-inflammatory and muscle relaxants for your back pain.  Return to the emergency department if any worsening or concerning symptoms

## 2020-04-27 NOTE — ED Provider Notes (Signed)
[facility] EMERGENCY DEPARTMENT Provider Note   CSN: [id number] Arrival date & time: 04/27/20  1059     History No chief complaint on file. Chief complaint of hematuria  Michael Glover is a 34 y.o. male.  He is complaining of low back pain and intermittent blood in his urine.  No recent trauma but he said he had an injury a few months ago.  It flares up now and again.  He was seen for hematuria 2 weeks ago at [facility] and had a negative CT KUB.  He does not have insurance and never followed up with urology.  He denies any dysuria, discharge or penile lesions.  No fevers.  No numbness or weakness no bowel bladder incontinence.   The history is provided by the patient.  Hematuria This is a recurrent problem. The current episode started more than 1 week ago. The problem occurs daily. The problem has not changed since onset.Pertinent negatives include no chest pain, no abdominal pain, no headaches and no shortness of breath. Nothing aggravates the symptoms. Nothing relieves the symptoms. He has tried nothing for the symptoms. The treatment provided no relief.       Past Medical History:  Diagnosis Date  . Asthma   . Depression     Patient Active Problem List   Diagnosis Date Noted  . Nausea & vomiting 12/23/2011  . Depression 12/23/2011  . Abdominal pain 12/23/2011  . Black stool 12/23/2011    History reviewed. No pertinent surgical history.     No family history on file.  Social History   Tobacco Use  . Smoking status: Current Every Day Smoker    Types: Cigarettes  . Smokeless tobacco: Never Used  Substance Use Topics  . Alcohol use: No  . Drug use: No    Home Medications Prior to Admission medications   Medication Sig Start Date End Date Taking? Authorizing Provider  ARIPiprazole (ABILIFY) 20 MG tablet Take 20 mg by mouth every evening. 03/24/20  Yes [provider]  FLUoxetine (PROZAC) 20 MG capsule Take 20 mg by mouth every morning. 03/23/20   Yes [provider]  cyclobenzaprine (FLEXERIL) 10 MG tablet Take 1 tablet (10 mg total) by mouth 2 (two) times daily as needed for muscle spasms. 09/12/19   Carlisle Cater, PA-C  ibuprofen (ADVIL) 600 MG tablet Take 1 tablet (600 mg total) by mouth every 6 (six) hours as needed. 09/12/19   Carlisle Cater, PA-C  naproxen (NAPROSYN) 500 MG tablet Take 1 tablet (500 mg total) by mouth 2 (two) times daily. 04/13/20   Petrucelli, Samantha R, PA-C  ondansetron (ZOFRAN ODT) 4 MG disintegrating tablet Take 1 tablet (4 mg total) by mouth every 8 (eight) hours as needed for nausea or vomiting. 04/13/20   Petrucelli, Glynda Jaeger, PA-C    Allergies    Patient has no known allergies.  Review of Systems   Review of Systems  Constitutional: Negative for chills and fever.  HENT: Negative for sore throat.   Eyes: Negative for visual disturbance.  Respiratory: Negative for shortness of breath.   Cardiovascular: Negative for chest pain.  Gastrointestinal: Negative for abdominal pain.  Genitourinary: Positive for hematuria. Negative for discharge, dysuria and testicular pain.  Musculoskeletal: Positive for back pain.  Skin: Negative for rash.  Neurological: Negative for headaches.    Physical Exam Updated Vital Signs BP (!) 136/92   Pulse 65   Temp 98.2 F (36.8 C) (Oral)   Resp 16   Ht 5'  10" (1.778 m)   Wt 106.6 kg   SpO2 100%   BMI 33.72 kg/m   Physical Exam Vitals and nursing note reviewed.  Constitutional:      Appearance: He is well-developed.  HENT:     Head: Normocephalic and atraumatic.  Eyes:     Conjunctiva/sclera: Conjunctivae normal.  Cardiovascular:     Rate and Rhythm: Normal rate and regular rhythm.     Heart sounds: No murmur.  Pulmonary:     Effort: Pulmonary effort is normal. No respiratory distress.     Breath sounds: Normal breath sounds.  Abdominal:     Palpations: Abdomen is soft.     Tenderness: There is no abdominal tenderness.  Musculoskeletal:         General: Normal range of motion.     Cervical back: Neck supple.  Skin:    General: Skin is warm and dry.     Capillary Refill: Capillary refill takes less than 2 seconds.  Neurological:     General: No focal deficit present.     Mental Status: He is alert.     Sensory: No sensory deficit.     Motor: No weakness.     Gait: Gait normal.     ED Results / Procedures / Treatments   Labs (all labs ordered are listed, but only abnormal results are displayed) Labs Reviewed  BASIC METABOLIC PANEL - Abnormal; Notable for the following components:      Result Value   Glucose, Bld 101 (*)    All other components within normal limits  URINALYSIS, ROUTINE W REFLEX MICROSCOPIC  CBC WITH DIFFERENTIAL/PLATELET    EKG None  Radiology No results found.  Procedures Procedures (including critical care time)  Medications Ordered in ED Medications - No data to display  ED Course  I have reviewed the triage vital signs and the nursing notes.  Pertinent labs & imaging results that were available during my care of the patient were reviewed by me and considered in my medical decision making (see chart for details).  Clinical Course as of Apr 28 1735  Tue Apr 27, 2020  [id number] 34 year old male complaining of low back pain intermittently since an injury months ago.  Also with intermittent hematuria.  No hematuria here.  Normal white count afebrile.  Prior history of drug use, denies current use.   [MB]    Clinical Course User Index [MB] Terrilee Files, MD   MDM Rules/Calculators/A&P                     Differential diagnosis includes infection, renal colic, idiopathic, nephritic syndrome.  Patient's urinalysis here is completely clean with no redness or whites.  Low hemoglobin.  Normal chemistries including normal renal function.  Reviewed prior ED visit 2 weeks ago in which she had a CT KUB along with urinalysis that had an indeterminate culture.  Recommended that the patient follow-up with  urology and will treat his back pain with symptomatic treatment including NSAIDs and muscle relaxant.  Final Clinical Impression(s) / ED Diagnoses Final diagnoses:  Acute bilateral low back pain without sciatica  Hematuria, unspecified type    Rx / DC Orders ED Discharge Orders         Ordered    cyclobenzaprine (FLEXERIL) 10 MG tablet  2 times daily PRN     04/27/20 1312    naproxen (NAPROSYN) 500 MG tablet  2 times daily     04/27/20 1312  Terrilee Files, MD 04/27/20 [phone]

## 2020-06-01 ENCOUNTER — Encounter (HOSPITAL_BASED_OUTPATIENT_CLINIC_OR_DEPARTMENT_OTHER): Payer: Self-pay | Admitting: Emergency Medicine

## 2020-06-01 ENCOUNTER — Other Ambulatory Visit: Payer: Self-pay

## 2020-06-01 ENCOUNTER — Emergency Department (HOSPITAL_BASED_OUTPATIENT_CLINIC_OR_DEPARTMENT_OTHER)
Admission: EM | Admit: 2020-06-01 | Discharge: 2020-06-01 | Disposition: A | Discharge status: Home / Self Care | Payer: Self-pay | Attending: Emergency Medicine | Admitting: Emergency Medicine

## 2020-06-01 DIAGNOSIS — J4521 Mild intermittent asthma with (acute) exacerbation: Secondary | ICD-10-CM | POA: Insufficient documentation

## 2020-06-01 DIAGNOSIS — F17210 Nicotine dependence, cigarettes, uncomplicated: Secondary | ICD-10-CM | POA: Insufficient documentation

## 2020-06-01 DIAGNOSIS — Z79899 Other long term (current) drug therapy: Secondary | ICD-10-CM | POA: Insufficient documentation

## 2020-06-01 MED ORDER — ALBUTEROL SULFATE HFA 108 (90 BASE) MCG/ACT IN AERS
6.0000 | INHALATION_SPRAY | Freq: Once | RESPIRATORY_TRACT | Status: AC
  Administered 2020-06-01: 6 via RESPIRATORY_TRACT

## 2020-06-01 MED ORDER — PREDNISONE 50 MG PO TABS
60.0000 mg | ORAL_TABLET | Freq: Once | ORAL | Status: AC
  Administered 2020-06-01: 60 mg via ORAL
  Filled 2020-06-01: qty 1

## 2020-06-01 MED ORDER — ALBUTEROL SULFATE HFA 108 (90 BASE) MCG/ACT IN AERS
INHALATION_SPRAY | RESPIRATORY_TRACT | Status: AC
  Filled 2020-06-01: qty 6.7

## 2020-06-01 NOTE — ED Triage Notes (Signed)
Pt arrives pov c/o sob that started last night, endorses hx of asthma. Pt reports using brothers inhaler last night, out of his own medication.

## 2020-06-01 NOTE — ED Provider Notes (Signed)
[facility] EMERGENCY DEPARTMENT Provider Note   CSN: [id number] Arrival date & time: 06/01/20  1124     History Chief Complaint  Patient presents with  . Shortness of Breath    Michael Glover is a 33 y.o. male with past medical history notable for asthma who presents to the ED with chest tightness and shortness of breath symptoms comparable to his prior episodes of asthma exacerbation.  Patient does not have a primary care provider due to lack of insurance.  Patient states that his asthma exacerbation began shortly after the rain came, which is a known trigger for him.  He states that he can usually control his symptoms with a humidifier, but was unable to ease his shortness of breath.  He even used 2 puffs of his brother's albuterol inhaler, with no effect.  He states that he has been dealing with asthma since he was a young child, but does not currently have any medications or outpatient follow-up.  He denies any fevers, recent illness or infection, chest pain beyond his chest tightness, exertional chest discomfort, cough, nausea or vomiting, diaphoresis, or other symptoms.  He reports that his last exacerbation was approximately 2 months ago.  HPI     Past Medical History:  Diagnosis Date  . Asthma   . Depression     Patient Active Problem List   Diagnosis Date Noted  . Nausea & vomiting 12/23/2011  . Depression 12/23/2011  . Abdominal pain 12/23/2011  . Black stool 12/23/2011    History reviewed. No pertinent surgical history.     History reviewed. No pertinent family history.  Social History   Tobacco Use  . Smoking status: Current Every Day Smoker    Types: Cigarettes  . Smokeless tobacco: Never Used  Substance Use Topics  . Alcohol use: No  . Drug use: No    Home Medications Prior to Admission medications   Medication Sig Start Date End Date Taking? Authorizing Provider  ARIPiprazole (ABILIFY) 20 MG tablet Take 20 mg by mouth every evening.  03/24/20   [provider]  cyclobenzaprine (FLEXERIL) 10 MG tablet Take 1 tablet (10 mg total) by mouth 2 (two) times daily as needed for muscle spasms. 04/27/20   Hayden Rasmussen, MD  FLUoxetine (PROZAC) 20 MG capsule Take 20 mg by mouth every morning. 03/23/20   [provider]  ibuprofen (ADVIL) 600 MG tablet Take 1 tablet (600 mg total) by mouth every 6 (six) hours as needed. 09/12/19   Carlisle Cater, PA-C  naproxen (NAPROSYN) 500 MG tablet Take 1 tablet (500 mg total) by mouth 2 (two) times daily. 04/27/20   Hayden Rasmussen, MD  ondansetron (ZOFRAN ODT) 4 MG disintegrating tablet Take 1 tablet (4 mg total) by mouth every 8 (eight) hours as needed for nausea or vomiting. 04/13/20   Petrucelli, Glynda Jaeger, PA-C    Allergies    Patient has no known allergies.  Review of Systems   Review of Systems  All other systems reviewed and are negative.   Physical Exam Updated Vital Signs BP 139/81 (BP Location: Right Arm)   Pulse 69   Temp 97.7 F (36.5 C) (Oral)   Resp 14   SpO2 100%   Physical Exam Vitals and nursing note reviewed. Exam conducted with a chaperone present.  Constitutional:      Appearance: Normal appearance.  HENT:     Head: Normocephalic and atraumatic.  Eyes:     General: No scleral icterus.  Conjunctiva/sclera: Conjunctivae normal.  Cardiovascular:     Rate and Rhythm: Normal rate and regular rhythm.     Pulses: Normal pulses.     Heart sounds: Normal heart sounds.  Pulmonary:     Comments: No significant increased work of breathing.  Chest rises symmetric.  Breath sounds intact bilaterally.  Mild wheezing auscultated diffusely.  No chest wall tenderness to palpation. Musculoskeletal:     Right lower leg: No edema.     Left lower leg: No edema.  Skin:    General: Skin is dry.  Neurological:     Mental Status: He is alert and oriented to person, place, and time.     GCS: GCS eye subscore is 4. GCS verbal subscore is 5. GCS motor subscore is  6.  Psychiatric:        Mood and Affect: Mood normal.        Behavior: Behavior normal.        Thought Content: Thought content normal.     ED Results / Procedures / Treatments   Labs (all labs ordered are listed, but only abnormal results are displayed) Labs Reviewed - No data to display  EKG None  Radiology No results found.  Procedures Procedures (including critical care time)  Medications Ordered in ED Medications  albuterol (VENTOLIN HFA) 108 (90 Base) MCG/ACT inhaler 6 puff (6 puffs Inhalation Given 06/01/20 1210)  predniSONE (DELTASONE) tablet 60 mg (60 mg Oral Given 06/01/20 1216)    ED Course  I have reviewed the triage vital signs and the nursing notes.  Pertinent labs & imaging results that were available during my care of the patient were reviewed by me and considered in my medical decision making (see chart for details).    MDM Rules/Calculators/A&P                      Patient's history and exam is consistent with an acute asthma exacerbation.  Patient is confident this is consistent with his prior episodes and states that rain is a known trigger.  He was given 60 mg prednisone p.o. here in the ED along with 6 puffs albuterol.  On subsequent evaluation patient is feeling entirely improved.  He states that he is back at baseline and is prepared to go home.  I did not obtain imaging or basic laboratory work-up today because patient denies any fevers, cough, headache, congestion, or other symptoms aside from his reported chest tightness and wheezing.  He was, but this was consistent with asthma exacerbation and improved after 6 puffs albuterol and 60 mg Deltasone.  Do not feel as though he needs to go home with prednisone, but he will be able to take his albuterol inhaler home with him.  On repeat evaluation he is CTA bilaterally with no increased work of breathing.  His vital signs are stable and WNL.  Patient recently started smoking cigarettes and I advised him to  discontinue as it could also precipitate his asthma exacerbations.  The patient was counseled on the dangers of tobacco use, and was advised to quit.  Reviewed strategies to maximize success, including removing cigarettes and smoking materials from environment, stress management, substitution of other forms of reinforcement, support of family/friends and written materials. Total time was 5 min CPT code 03500.   Plan is for patient to get established with [facility] for ongoing evaluation and management of his health and wellbeing.  Strict ED return precautions discussed.  They  were provided opportunity to ask any additional questions and have none at this time. They have expressed understanding of verbal discharge instructions as well as return precautions and are agreeable to the plan.    Final Clinical Impression(s) / ED Diagnoses Final diagnoses:  Mild intermittent asthma with exacerbation    Rx / DC Orders ED Discharge Orders    None       Lorelee New, PA-C 06/01/20 1322    Geoffery Lyons, MD 06/01/20 1441

## 2020-06-01 NOTE — Discharge Instructions (Signed)
Please call [facility] to get established with a primary care provider for ongoing evaluation and management of your health and wellbeing.  Please use your albuterol inhaler should you develop another asthma exacerbation.  However, it is much more important that you get established with a primary care provider so that he can have this more closely followed.  It is important to have continuity of care.  Please return to the ED or seek immediate medical attention should you experience any new or worsening symptoms.
